# Patient Record
Sex: Male | Born: 1975
Health system: Southern US, Community
[De-identification: ages and names within clinical notes are randomized; demographics above are authoritative.]

## PROBLEM LIST (undated history)

## (undated) DIAGNOSIS — I10 Essential (primary) hypertension: Secondary | ICD-10-CM

## (undated) DIAGNOSIS — R251 Tremor, unspecified: Secondary | ICD-10-CM

## (undated) DIAGNOSIS — G249 Dystonia, unspecified: Secondary | ICD-10-CM

## (undated) HISTORY — PX: CHOLECYSTECTOMY: SHX55

## (undated) HISTORY — DX: Essential (primary) hypertension: I10

---

## 2000-02-02 ENCOUNTER — Inpatient Hospital Stay (HOSPITAL_COMMUNITY): Admission: EM | Admit: 2000-02-02 | Discharge: 2000-02-07 | Payer: Self-pay | Admitting: Psychiatry

## 2005-07-08 ENCOUNTER — Inpatient Hospital Stay (HOSPITAL_COMMUNITY): Admission: EM | Admit: 2005-07-08 | Discharge: 2005-07-10 | Payer: Self-pay | Admitting: Emergency Medicine

## 2010-03-12 ENCOUNTER — Emergency Department (HOSPITAL_COMMUNITY)
Admission: EM | Admit: 2010-03-12 | Discharge: 2010-03-13 | Payer: Self-pay | Source: Home / Self Care | Admitting: Emergency Medicine

## 2010-03-13 ENCOUNTER — Inpatient Hospital Stay (HOSPITAL_COMMUNITY)
Admission: RE | Admit: 2010-03-13 | Discharge: 2010-03-16 | Payer: Self-pay | Source: Home / Self Care | Attending: Psychiatry | Admitting: Psychiatry

## 2010-05-04 ENCOUNTER — Emergency Department (HOSPITAL_COMMUNITY)
Admission: EM | Admit: 2010-05-04 | Discharge: 2010-05-04 | Disposition: A | Discharge status: Home / Self Care | Payer: Medicaid Other | Attending: Emergency Medicine | Admitting: Emergency Medicine

## 2010-05-04 ENCOUNTER — Emergency Department (HOSPITAL_COMMUNITY): Admit: 2010-05-04 | Discharge: 2010-05-04 | Disposition: A | Discharge status: Home / Self Care | Payer: Medicaid Other

## 2010-05-04 DIAGNOSIS — Z79899 Other long term (current) drug therapy: Secondary | ICD-10-CM | POA: Insufficient documentation

## 2010-05-04 DIAGNOSIS — R109 Unspecified abdominal pain: Secondary | ICD-10-CM | POA: Insufficient documentation

## 2010-05-04 DIAGNOSIS — I10 Essential (primary) hypertension: Secondary | ICD-10-CM | POA: Insufficient documentation

## 2010-05-04 DIAGNOSIS — E119 Type 2 diabetes mellitus without complications: Secondary | ICD-10-CM | POA: Insufficient documentation

## 2010-05-04 DIAGNOSIS — K8020 Calculus of gallbladder without cholecystitis without obstruction: Secondary | ICD-10-CM | POA: Insufficient documentation

## 2010-05-04 LAB — URINALYSIS, ROUTINE W REFLEX MICROSCOPIC
Bilirubin Urine: NEGATIVE
pH: 7 (ref 5.0–8.0)

## 2010-05-04 LAB — DIFFERENTIAL
Eosinophils Relative: 4 % (ref 0–5)
Neutro Abs: 5.8 10*3/uL (ref 1.7–7.7)

## 2010-05-04 LAB — COMPREHENSIVE METABOLIC PANEL
AST: 20 U/L (ref 0–37)
BUN: 14 mg/dL (ref 6–23)
Calcium: 9.5 mg/dL (ref 8.4–10.5)
Chloride: 104 mEq/L (ref 96–112)
Glucose, Bld: 66 mg/dL — ABNORMAL LOW (ref 70–99)
Potassium: 3.5 mEq/L (ref 3.5–5.1)
Sodium: 137 mEq/L (ref 135–145)
Total Bilirubin: 0.5 mg/dL (ref 0.3–1.2)

## 2010-05-04 LAB — CBC
HCT: 43.5 % (ref 39.0–52.0)
Hemoglobin: 15.4 g/dL (ref 13.0–17.0)
MCHC: 35.4 g/dL (ref 30.0–36.0)
MCV: 80.9 fL (ref 78.0–100.0)
RBC: 5.38 MIL/uL (ref 4.22–5.81)
RDW: 13.4 % (ref 11.5–15.5)
WBC: 9.4 10*3/uL (ref 4.0–10.5)

## 2010-05-04 LAB — LIPASE, BLOOD: Lipase: 25 U/L (ref 11–59)

## 2010-05-15 ENCOUNTER — Other Ambulatory Visit: Payer: Self-pay | Admitting: General Surgery

## 2010-05-15 ENCOUNTER — Encounter (HOSPITAL_COMMUNITY): Payer: Medicaid Other | Attending: General Surgery

## 2010-05-15 DIAGNOSIS — Z01812 Encounter for preprocedural laboratory examination: Secondary | ICD-10-CM | POA: Insufficient documentation

## 2010-05-15 LAB — BASIC METABOLIC PANEL
BUN: 10 mg/dL (ref 6–23)
CO2: 27 mEq/L (ref 19–32)
Calcium: 9.4 mg/dL (ref 8.4–10.5)
Chloride: 101 mEq/L (ref 96–112)
Creatinine, Ser: 0.79 mg/dL (ref 0.4–1.5)
GFR calc non Af Amer: >60 mL/min (ref >60)
Glucose, Bld: 82 mg/dL (ref 70–99)
Potassium: 3.9 mEq/L (ref 3.5–5.1)

## 2010-05-15 LAB — CBC
Hemoglobin: 15.3 g/dL (ref 13.0–17.0)
MCH: 28 pg (ref 26.0–34.0)
MCHC: 33.6 g/dL (ref 30.0–36.0)
Platelets: 272 10*3/uL (ref 150–400)

## 2010-05-18 ENCOUNTER — Other Ambulatory Visit: Payer: Self-pay | Admitting: General Surgery

## 2010-05-18 ENCOUNTER — Ambulatory Visit (HOSPITAL_COMMUNITY)
Admission: RE | Admit: 2010-05-18 | Discharge: 2010-05-18 | Disposition: A | Discharge status: Home / Self Care | Payer: Medicaid Other | Source: Ambulatory Visit | Attending: General Surgery | Admitting: General Surgery

## 2010-05-18 DIAGNOSIS — K8020 Calculus of gallbladder without cholecystitis without obstruction: Secondary | ICD-10-CM | POA: Insufficient documentation

## 2010-05-18 NOTE — H&P (Signed)
NAME:  Aaron Coffey, Aaron Coffey NO.:  [id number]  MEDICAL RECORD NO.:  [id number]           PATIENT TYPE:  O  LOCATION:  DAY                           FACILITY:  [facility]  PHYSICIAN:  Tilford Pillar, MD      DATE OF BIRTH:  07-13-1975  DATE OF ADMISSION: DATE OF DISCHARGE:  LH                             HISTORY & PHYSICAL   CHIEF COMPLAINT:  Right upper quadrant abdominal pain.  HISTORY OF PRESENT ILLNESS:  The patient is a 35 year old male with a history of increasing right upper quadrant abdominal pain.  This has increased over the last several weeks and actually presents to the emergency department with the pain.  He has had no similar symptomatology.  He has no nausea or vomiting.  No fevers or chills. Pain was localized to the right upper quadrant with no significant radiation.  He does have occasional bloating with fatty greasy food.  He denies any change in bowel movements.  No melena.  No hematochezia.  No history of jaundice.  PAST MEDICAL HISTORY:  Hypertension and diabetes mellitus.  PAST SURGICAL HISTORY:  None.  MEDICATIONS:  He is on a hypertensive medication.  He is not insulin. He is on oral anti-glycemic medication although the patient is not aware of the names.  ALLERGIES:  No known drug allergies.  SOCIAL HISTORY:  No tobacco.  Occasional alcohol.  No recreational drug abuse.  Occupation, none.  FAMILY HISTORY:  Positive family history of gallbladder disease.  No other pertinent or contributory past family medical history.  REVIEW OF SYSTEMS:  CONSTITUTIONAL:  Unremarkable.  EYES:  Unremarkable. Uses spironolactone.  RESPIRATORY:  Unremarkable.  CARDIOVASCULAR: Unremarkable.  GASTROINTESTINAL:  As per HPI.  GENITOURINARY:  Increased frequency of urination.  MUSCULOSKELETAL:  Unremarkable.  SKIN: Unremarkable.  ENDOCRINE:  Complaints of no energy and increased thirst. NEURO:  Complains of tremors.  PHYSICAL EXAMINATION:  GENERAL:  The patient  is an obese male, in no acute distress.  He is alert and oriented x3. HEENT:  No deformities or masses.  Eyes:  Pupils equal, round, reactive. Extraocular movements are intact.  No scleral icterus or conjunctival pallor is noted.  Oral mucosa pink.  Normal occlusion. NECK:  Trachea is midline.  No cervical lymphadenopathy.  He has no thyroid nodularity or goiter. PULMONARY:  Unlabored respiration.  He is clear to auscultation bilaterally.  No wheezes or crackles. CARDIOVASCULAR:  Regular rate and rhythm.  No murmurs or gallops. ABDOMEN:  Positive bowel sounds.  Abdomen is soft, obese.  He does have mild right upper quadrant abdominal pain.  No classic Eulah Pont sign is elicited.  No hernias or masses are apparent. EXTREMITIES:  Warm and dry.  PERTINENT LABORATORY AND RADIOGRAPHIC STUDIES:  The patient does have a right upper quadrant ultrasound which demonstrates stones within the gallbladder.  No biliary tree dilatation.  No pericholecystic fluid or gallbladder wall thickening.  ASSESSMENT AND PLAN:  Cholelithiasis.  At this time the risks and alternatives of laparoscopic possible open cholecystectomy were discussed at length with the patient including, but not limited risk of bleeding, infection, bile leak, small-bowel injury, bile duct injury as  well as possibility of intraoperative cardiac and pulmonary events.  His questions and concerns are addressed.  He will be consented to surgical intervention as early as convenient.     Tilford Pillar, MD     BZ/MEDQ  D:  05/17/2010  T:  05/17/2010  Job:  [id number]  Electronically Signed by Tilford Pillar MD on 05/18/2010 11:48:21 AM

## 2010-06-08 NOTE — Op Note (Signed)
NAME:  Aaron Coffey, Aaron Coffey NO.:  [id number]  MEDICAL RECORD NO.:  [id number]           PATIENT TYPE:  O  LOCATION:  DAYP                          FACILITY:  [facility]  PHYSICIAN:  Tilford Pillar, MD      DATE OF BIRTH:  1975/09/05  DATE OF PROCEDURE:  05/18/2010 DATE OF DISCHARGE:  05/18/2010                              OPERATIVE REPORT   PREOPERATIVE DIAGNOSES:  Cholelithiasis.  POSTOPERATIVE DIAGNOSIS:  Cholelithiasis.  PROCEDURE:  Laparoscopic cholecystectomy.  SURGEON:  Tilford Pillar, MD  ANESTHESIA:  General endotracheal.  LOCAL ANESTHETIC:  0.5% Sensorcaine plain.  SPECIMEN:  Gallbladder.  ESTIMATED BLOOD LOSS:  Minimal.  INDICATIONS:  The patient is a 35 year old male who presented to my office with a history of right upper quadrant abdominal pain.  Workup was consistent for biliary etiology.  Risks, benefits and alternatives of laparoscopic possible open cholecystectomy were discussed at length with the patient including but not limited to the risk of bleeding, infection, bile leak, small bowel injury, common bile duct injury as well as possibility of intraoperative cardiac and pulmonary events.  His questions and concerns were addressed and the patient was consented for planned procedure.  OPERATION:  The patient was taken to the operating room and was placed in supine position on the operating room table, at which time the general anesthetic was administered.  Once the patient was asleep, he was endotracheally intubated by Anesthesia.  At this point his abdomen was prepped with DuraPrep solution and draped in standard fashion.  A stab incision was created supraumbilically with an 11-blade scalpel. Additional dissection down through the subcuticular tissue was carried out using a Kocher clamp which was utilized to grasp the anterior abdominal fascia with this anteriorly.  A Veress needle was inserted. Saline drop test utilized to confirm  intraperitoneal placement and then pneumoperitoneum was initiated.  Once sufficient pneumoperitoneum was obtained, an 11-mm trocar was inserted over laparoscope allowing visualization of the trocar entering into the peritoneal cavity.  At this time, the inner cannula was removed.  The laparoscope was reinserted.  There was no evidence of any Veress or trocar needle placement injury.  At this time the remaining trocars were placed with the 11-mm trocar in the epigastrium, a 5 mm trocar in the midline between the two 11-mm trocars and the 5-mm trocar in the right lateral abdominal wall.  The patient was placed in a reverse Trendelenburg left lateral decubitus position.  The gallbladder was grasped and the fundus was lifted up and over the right lobe of the liver.  The peritoneal reflection onto the infundibulum was bluntly stripped using a [place] exposing the cystic duct which was then entered as it entered into the infundibulum.  A window was created behind the cystic duct. Three EndoClips were placed proximally, one distally and the cystic duct was divided between two most distal clips.  Similarly the cystic artery was identified.  Two EndoClips were placed proximally, one distally and the cystic artery was divided between the two most distal clips.  At this time I did elect to use electrocautery to dissect the gallbladder free from the gallbladder fossa.  During this dissection, I did encounter a posterior branch of the cystic artery which was quickly controlled using additional EndoClips x2 and then the gallbladder again was removed from the gallbladder fossa using electrocautery.  Once it was freed, it was placed into an EndoCatch bag and placed up and over the right lobe of the liver.  Inspection of the gallbladder fossa demonstrated excellent hemostasis having been obtained with electrocautery.  The EndoClips were noted to be in good position.  There was no evidence any  bile leak or any bleeding.  Due to the somewhat raw nature of the gallbladder fossa, I did place a piece of Surgicel SnoW into the gallbladder fossa for additional assistance, additional hemostasis.  At this time I turned attention to closure.  Using an Endoclose suture passing device, a 2-0 Vicryl suture was passed through both the 11-mm trocar sites.  With these sutures in place, the gallbladder was retrieved and removed through the umbilical trocar site. Due to the size of the stones as well as the size of the gallbladder, I did need to utilize both blunt and sharp dissection to enlarge the trocar site adequately to remove the gallbladder.  During this dissection, I did create a small slit within the EndoCatch bag.  The EndoCatch bag was removed separately from the gallbladder but to note it was intact.  The gallbladder was removed intact.  There was no spillage or any leakage of any stones and the gallbladder was sent as a permanent specimen to Pathology.  At this time the pneumoperitoneum was evacuated. Trocars were removed.  The Vicryl sutures were secured.  The local anesthetic was instilled.  A 4-0 Monocryl was utilized to reapproximate the skin edges at all 4 trocar sites.  The skin was washed and dried with moist and dry towel and Octylseal dermatic glue placed over all four trocar sites, allowed to dry and the drapes were removed.  The patient was allowed to come out of general anesthetic, was transferred back to regular hospital bed and was transferred to the postanesthetic care unit in stable condition.  At the conclusion of procedure, all instruments, sponge and needle counts were correct.  The patient tolerated the procedure extremely well.     Tilford Pillar, MD     BZ/MEDQ  D:  05/18/2010  T:  05/19/2010  Job:  [id number]  Electronically Signed by Tilford Pillar MD on 06/07/2010 09:26:56 PM

## 2010-06-11 LAB — DIFFERENTIAL
Basophils Absolute: 0 10*3/uL (ref 0.0–0.1)
Eosinophils Absolute: 0.3 10*3/uL (ref 0.0–0.7)
Eosinophils Relative: 3 % (ref 0–5)
Lymphocytes Relative: 21 % (ref 12–46)

## 2010-06-11 LAB — CBC
MCV: 82.6 fL (ref 78.0–100.0)
Platelets: 373 10*3/uL (ref 150–400)
RDW: 13.6 % (ref 11.5–15.5)
WBC: 9.8 10*3/uL (ref 4.0–10.5)

## 2010-06-11 LAB — HEPATIC FUNCTION PANEL
Albumin: 3.8 g/dL (ref 3.5–5.2)
Alkaline Phosphatase: 73 U/L (ref 39–117)
Total Protein: 7 g/dL (ref 6.0–8.3)

## 2010-06-11 LAB — BASIC METABOLIC PANEL
BUN: 11 mg/dL (ref 6–23)
Calcium: 9.5 mg/dL (ref 8.4–10.5)
Chloride: 103 mEq/L (ref 96–112)
Creatinine, Ser: 0.93 mg/dL (ref 0.4–1.5)
GFR calc non Af Amer: >60 mL/min (ref >60)

## 2010-06-11 LAB — URINALYSIS, ROUTINE W REFLEX MICROSCOPIC
Glucose, UA: NEGATIVE mg/dL
Hgb urine dipstick: NEGATIVE
Specific Gravity, Urine: 1.025 (ref 1.005–1.030)

## 2010-06-11 LAB — GLUCOSE, CAPILLARY
Glucose-Capillary: 106 mg/dL — ABNORMAL HIGH (ref 70–99)
Glucose-Capillary: 108 mg/dL — ABNORMAL HIGH (ref 70–99)
Glucose-Capillary: 121 mg/dL — ABNORMAL HIGH (ref 70–99)

## 2010-06-11 LAB — RAPID URINE DRUG SCREEN, HOSP PERFORMED
Barbiturates: NOT DETECTED
Benzodiazepines: NOT DETECTED

## 2010-06-11 LAB — ETHANOL: Alcohol, Ethyl (B): <5 mg/dL (ref 0–10)

## 2010-08-17 NOTE — Consult Note (Signed)
NAME:  Aaron Coffey, BORNTREGER NO.:  [id number]   MEDICAL RECORD NO.:  [id number]          PATIENT TYPE:  INP   LOCATION:  A212                          FACILITY:  [facility]   PHYSICIAN:  Kofi A. Gerilyn Pilgrim, M.D. DATE OF BIRTH:  12-Nov-1975   DATE OF CONSULTATION:  DATE OF DISCHARGE:                                   CONSULTATION   NEUROLOGICAL CONSULTATION:   REASON FOR CONSULTATION:  Altered mental status with fever and possible  encephalitis.   HISTORY:  This is a 35 year old Caucasian man who has a baseline history of  psychiatric problems.  The patient presented to the emergency room with  alteration of consciousness and was intubated.  He did have a low-grade  temperature.  Initial history raised the possibility that the patient may  have had an overdose.  At the same time however the notes indicate the  patient did not take more prescribed medications of his baseline medicines.  There is also no report of illegal drug use.  The patient was intubated  overnight and was weaned then extubated.  We were able to get a better  history this evening.  The patient himself indicates that he took about a  half a bottle of Robitussin because he was depressed and had issues with  some friends of his.  He did not elaborate what type of issues these were.  In general he seemed to have poor insight and cannot relate his baseline  medical history and other history in detail.  The patient, while admitting  to taking this medication, denies at this time having suicidal ideation.  Additionally, he also reports that although he took the Benadryl he did it  because he was mad but did not want to comit suicide.  He currently has  denied suicidal ideation at this time.  This was asked several times.  He  denies any headaches, rashes, illicit drug use and reports that he was  normal before this happened.   PAST MEDICAL HISTORY:  The patient has had a psychiatric admission  previously due to  behavioral disruption.  There is a history of mild mental  retardation/developmental delay, ADHD, essential tremor, diabetes mellitus  and hyperlipidemia.   ADMISSION MEDICATIONS:  Unknown at this time.   SOCIAL HISTORY:  No alcohol, drug or illicit drug use.   REVIEW OF SYSTEMS:  As stated in history of present illness.   PHYSICAL EXAMINATION:  VITAL SIGNS:  The patient is afebrile and has been  afebrile (low grade 99.9 early this morning, latest temperature 99.4).  Blood pressure 110/60, pulse 80, respirations 20.  GENERAL:  The patient is awake and alert.  He is in no acute distress.  He  is extubated and on the floor.  HEENT EVALUATION:  Neck is supple.  EXTREMITIES:  Moved all four extremities.  No significant varicosities or  edema.  SKIN:  No rashes noted.  There is a bruise on the right knee.  NEUROLOGICAL:  Mentation:  The patient is awake and alert.  He has  significant difficulty with insight and judgment.  He is immature appearing  with  a childish voice and childish gestures.  No dysarthria or aphasia  noted.  Followed commands bilaterally well.  Cranial nerve evaluation:  He  has a right exotropia.  Extraocular movements are intact.  Visual fields are  full.  Facial muscle strength symmetric.  Tongue is midline.  Uvula is  midline.  Shoulder shrugs are normal.  Motor examination shows normal tone,  bulk and strength.  Coordination shows frequent moderate amplitudes/moderate  frequency tremors involving both upper extremities most pronounced in the  postural position but present at rest and with activity.  Reflexes are  preserved.  Plantar reflexes both downgoing.  Sensation normal to  temperature and light touch.   STUDIES:  CT scan of the brain shows no acute process.  Chest x-ray done  while intubated shows bibasilar atelectasis.  Urinalysis negative.  Urine  drug screen positive for metabolites of benzodiazepine.  Toxicology screen  negative for alcohol, salicylates  and Tylenol.  WBC 12, hemoglobin 15,  platelet count of 438, ESR 4.  Sodium 137, potassium 3.3, chloride 110, CO2  20, glucose 100, BUN 10, creatinine 0.8, calcium 9.  Liver enzymes normal.  Spinal fluid analysis:  Wbc's 0, rbc's 0, glucose 74, and protein 43.   ASSESSMENT:  1.  Resolving encephalopathy due to overdose on Robitussin.  2.  Baseline essential tremor.  3.  Baseline mental retardation.  4.  Baseline psychiatric problems.  5.  Depression.   RECOMMENDATIONS:  1.  I think we should discontinue his acyclovir and doxycycline.  2.  I think the ACT team ought to be consulted.  3.  Consider EEG to rule out for seizure.   Thanks for this consultation.      Kofi A. Gerilyn Pilgrim, M.D.  Electronically Signed     KAD/MEDQ  D:  07/09/2005  T:  07/09/2005  Job:  [id number]

## 2010-08-17 NOTE — H&P (Signed)
NAME:  Aaron Coffey, Aaron Coffey NO.:  [id number]   MEDICAL RECORD NO.:  [id number]          PATIENT TYPE:  EMS   LOCATION:  ED                            FACILITY:  [facility]   PHYSICIAN:  Madelin Rear. Sherwood Gambler, MD  DATE OF BIRTH:  1976-03-25   DATE OF ADMISSION:  07/08/2005  DATE OF DISCHARGE:  LH                                HISTORY & PHYSICAL   CHIEF COMPLAINT:  Mental status changes.   HISTORY OF PRESENT ILLNESS:  Patient was seen by his mother and talked to  around one hour prior to the onset of mental status changes.  EMS personnel  were alerted with him stating to them that he possibly overdosed on  medication.  A thorough review of his medications in the field revealed no  missing pills.  He also had no access to any other illicit medications that  are known.   PAST MEDICAL HISTORY:  Pertinent for behavioral difficulties, delayed  development mentally/mild mental retardation, benign essential tremor,  maintained on beta blocker for same, and hyperlipidemia, maintained on  Zocor.   SOCIAL HISTORY:  No illicit drug use.  No known alcohol.   FAMILY HISTORY:  Noncontributory.   REVIEW OF SYSTEMS:  Unavailable, as the patient is intubated at the time of  my exam.  History was obtained, however, from his family members, including  his grandmother and his mother.  There were no headaches, fevers known prior  to onset.  No tick bites.   PHYSICAL EXAMINATION:  GENERAL:  He is paralyzed on Pavulon and intubated  due to agitation exhibited in the emergency department per emergency room  physician evaluation.  SKIN:  Unremarkable with no evidence of exanthem or petechiae.  NECK:  No JVD or adenopathy.   DICTATION ENDS HERE.      Madelin Rear. Sherwood Gambler, MD  Electronically Signed     LJF/MEDQ  D:  07/08/2005  T:  07/08/2005  Job:  [id number]

## 2010-08-17 NOTE — H&P (Signed)
NAME:  KRIS, NO NO.:  [id number]   MEDICAL RECORD NO.:  [id number]          PATIENT TYPE:  INP   LOCATION:  IC06                          FACILITY:  [facility]   PHYSICIAN:  Madelin Rear. Sherwood Gambler, MD  DATE OF BIRTH:  [birth year]   DATE OF ADMISSION:  07/08/2005  DATE OF DISCHARGE:  LH                                HISTORY & PHYSICAL   CHIEF COMPLAINT:  Mental status changes.   HISTORY OF PRESENT ILLNESS:  Patient had EMS alerted at home one hour after  speaking apparently normally to his mother, stating to them that he  overdosed possibly on some medications.  Thorough review of his medications  in the field by EMS personnel revealed no excess consumption of his known  medication regimen, nor was there any access to illicit meds or other  medications in the house, according to his mother.  History had to be  obtained from multiple people, including his grandmother and his mother,  because the patient is intubated and unresponsive in the emergency room.  He  presented to the emergency department with a low-grade fever, mental status,  and agitation.  He was not conversant at that time, per Dr. Denny Levy note.   PAST MEDICAL HISTORY:  Behavioral difficulties, developmental delays, and  ADHD.  He has hyperlipidemia.   SOCIAL HISTORY:  No alcohol or other drug use, as noted above.   FAMILY HISTORY:  Noncontributory.   REVIEW OF SYSTEMS:  Unavailable secondary to the patient's paralyzed,  intubated state.  Again, the information was obtained by family members, the  ER physician, and EMS personnel.   PHYSICAL EXAMINATION:  GENERAL:  He is intubated and paralyzed on Pavulon.  HEAD/NECK:  No JVD or adenopathy.  The suppleness of the neck could not be  assessed secondary to paralyzed state.  CHEST:  Clear.  CARDIAC:  Regular rhythm without murmur, rub or gallop.  ABDOMEN:  Benign.  No organomegaly or masses.  EXTREMITIES:  No clubbing, cyanosis or edema.  __________.   Complete flaccid  paralysis secondary to Pavulon.   LABORATORY:  CT head is pending, as is a lumbar puncture, which is  anticipated.   His blood count revealed an elevated white count slightly at 12.5 with a  little left shift and mild elevation of his platelet count.  His metabolic  profile was unremarkable.  Urinalysis pending.  Urine drug screen pending.   Chest x-ray showed an intubated patient without evidence of pneumonic  infiltrate.   IMPRESSION:  Mental status changes with fever, rule out encephalitis or  meningitis.  Appropriate studies have been ordered and are pending.  All  possibilities will be covered with broad-spectrum antibiotic and antiviral  treatments specifically aimed at herpes virus, pending CSF studies.  Will  cover the most life-threatening and neurologically devastating possibilities  at present, pending further studies.  A neurology consultation is also  anticipated.      Madelin Rear. Sherwood Gambler, MD  Electronically Signed     LJF/MEDQ  D:  07/08/2005  T:  07/08/2005  Job:  [id number]

## 2010-08-17 NOTE — Discharge Summary (Signed)
NAME:  ADEM, COSTLOW NO.:  [id number]   MEDICAL RECORD NO.:  [id number]          PATIENT TYPE:  INP   LOCATION:  A212                          FACILITY:  [facility]   PHYSICIAN:  Madelin Rear. Sherwood Gambler, MD  DATE OF BIRTH:  [birth year]   DATE OF ADMISSION:  07/08/2005  DATE OF DISCHARGE:  04/11/2007LH                                 DISCHARGE SUMMARY   DISCHARGE MEDICATIONS:  1.  Zocor 20 mg p.o. daily.  2.  Avandia 4 mg p.o. daily.  3.  Topamax 25 mg q.a.m. and q.p.m.  4.  Inderal 60 mg p.o. daily.  5.  Amaryl 2 mg p.o. daily.   DISCHARGE DIAGNOSES:  1.  Mental retardation.  2.  Benign essential tremor.  3.  Encephalopathy secondary to overdose of Robitussin,  4.  Depression.   SUMMARY:  The patient was admitted with mental status changes and  questionable inadvertent overdose/unintentional overdose of Robitussin.  The  possibility of encephalitis was entertained, and consultation ensued with  Dr. Gerilyn Pilgrim of neurology.  He had an uneventful recovery, and antibiotics  and antiviral medications were discontinued shortly post discharge as mental  status improved and laboratories ruled out the above differential.  He was  stable the day of discharge.   FOLLOWUP:  Follow up in office as needed.      Madelin Rear. Sherwood Gambler, MD  Electronically Signed     LJF/MEDQ  D:  08/06/2005  T:  08/07/2005  Job:  [id number]
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
## 2010-08-17 NOTE — H&P (Signed)
NAME:  Aaron Coffey, Aaron Coffey NO.:  [id number]   MEDICAL RECORD NO.:  [id number]          PATIENT TYPE:  EMS   LOCATION:  ED                            FACILITY:  [facility]   PHYSICIAN:  Madelin Rear. Sherwood Gambler, MD  DATE OF BIRTH:  18-Apr-1975   DATE OF ADMISSION:  07/08/2005  DATE OF DISCHARGE:  LH                                HISTORY & PHYSICAL   CHIEF COMPLAINT:  Mental status changes.   HISTORY OF PRESENT ILLNESS:  EMS personnel appeared at the patient's house  __________.  DICTATION ENDED AT THIS POINT.      Madelin Rear. Sherwood Gambler, MD  Electronically Signed     LJF/MEDQ  D:  07/08/2005  T:  07/08/2005  Job:  [id number]

## 2010-08-17 NOTE — Procedures (Signed)
NAME:  Aaron Coffey, Aaron Coffey NO.:  [id number]   MEDICAL RECORD NO.:  [id number]          PATIENT TYPE:  INP   LOCATION:  A212                          FACILITY:  [facility]   PHYSICIAN:  Edward L. Juanetta Gosling, M.D.DATE OF BIRTH:  Sep 24, 1975   DATE OF PROCEDURE:  DATE OF DISCHARGE:                                EKG INTERPRETATION   INTERPRETATION:  The rhythm appears to be a supraventricular tachycardia  with a rate of about 110.  There is left atrial enlargement.  Q-waves are  seen in the inferior leads, which may indicate a previous inferior  infarction, and clinical correlation suggested.  Q-T interval is prolonged.  The computer has read ST depression with potential for subendocardial  injury; I agree there is ST depression.  I do not see evidence of injury at  this time.   IMPRESSION:  Abnormal electrocardiogram.      Edward L. Juanetta Gosling, M.D.  Electronically Signed     ELH/MEDQ  D:  07/10/2005  T:  07/10/2005  Job:  [id number]

## 2014-02-28 ENCOUNTER — Encounter (HOSPITAL_COMMUNITY): Payer: Self-pay | Admitting: *Deleted

## 2014-02-28 ENCOUNTER — Emergency Department (HOSPITAL_COMMUNITY)
Admission: EM | Admit: 2014-02-28 | Discharge: 2014-03-01 | Disposition: A | Discharge status: Home / Self Care | Payer: Medicare Other | Attending: Emergency Medicine | Admitting: Emergency Medicine

## 2014-02-28 DIAGNOSIS — R4689 Other symptoms and signs involving appearance and behavior: Secondary | ICD-10-CM

## 2014-02-28 DIAGNOSIS — R251 Tremor, unspecified: Secondary | ICD-10-CM | POA: Insufficient documentation

## 2014-02-28 DIAGNOSIS — R45851 Suicidal ideations: Secondary | ICD-10-CM | POA: Diagnosis present

## 2014-02-28 DIAGNOSIS — R4589 Other symptoms and signs involving emotional state: Secondary | ICD-10-CM

## 2014-02-28 LAB — COMPREHENSIVE METABOLIC PANEL
ALBUMIN: 4.7 g/dL (ref 3.5–5.2)
ALK PHOS: 74 U/L (ref 39–117)
ALT: 35 U/L (ref 0–53)
ANION GAP: 18 — AB (ref 5–15)
AST: 29 U/L (ref 0–37)
BUN: 11 mg/dL (ref 6–23)
CHLORIDE: 99 meq/L (ref 96–112)
CO2: 23 mEq/L (ref 19–32)
Calcium: 10.2 mg/dL (ref 8.4–10.5)
Creatinine, Ser: 0.92 mg/dL (ref 0.50–1.35)
GFR calc Af Amer: >90 mL/min (ref >90)
GFR calc non Af Amer: >90 mL/min (ref >90)
Glucose, Bld: 118 mg/dL — ABNORMAL HIGH (ref 70–99)
POTASSIUM: 3.3 meq/L — AB (ref 3.7–5.3)
SODIUM: 140 meq/L (ref 137–147)
TOTAL PROTEIN: 8.2 g/dL (ref 6.0–8.3)
Total Bilirubin: 0.6 mg/dL (ref 0.3–1.2)

## 2014-02-28 LAB — CBC WITH DIFFERENTIAL/PLATELET
BASOS ABS: 0 10*3/uL (ref 0.0–0.1)
BASOS PCT: 0 % (ref 0–1)
EOS ABS: 0.1 10*3/uL (ref 0.0–0.7)
Eosinophils Relative: 1 % (ref 0–5)
HCT: 45.9 % (ref 39.0–52.0)
Hemoglobin: 16.3 g/dL (ref 13.0–17.0)
Lymphocytes Relative: 30 % (ref 12–46)
Lymphs Abs: 2.8 10*3/uL (ref 0.7–4.0)
MCH: 29.1 pg (ref 26.0–34.0)
MCHC: 35.5 g/dL (ref 30.0–36.0)
MCV: 82 fL (ref 78.0–100.0)
MONOS PCT: 9 % (ref 3–12)
Monocytes Absolute: 0.8 10*3/uL (ref 0.1–1.0)
NEUTROS ABS: 5.4 10*3/uL (ref 1.7–7.7)
NEUTROS PCT: 60 % (ref 43–77)
PLATELETS: 302 10*3/uL (ref 150–400)
RBC: 5.6 MIL/uL (ref 4.22–5.81)
RDW: 13.3 % (ref 11.5–15.5)
WBC: 9.1 10*3/uL (ref 4.0–10.5)

## 2014-02-28 LAB — URINALYSIS, ROUTINE W REFLEX MICROSCOPIC
GLUCOSE, UA: NEGATIVE mg/dL
Hgb urine dipstick: NEGATIVE
KETONES UR: NEGATIVE mg/dL
Leukocytes, UA: NEGATIVE
NITRITE: NEGATIVE
PH: 5.5 (ref 5.0–8.0)
PROTEIN: NEGATIVE mg/dL
Specific Gravity, Urine: >1.03 — ABNORMAL HIGH (ref 1.005–1.030)
Urobilinogen, UA: 0.2 mg/dL (ref 0.0–1.0)

## 2014-02-28 LAB — ETHANOL

## 2014-02-28 MED ORDER — POTASSIUM CHLORIDE CRYS ER 20 MEQ PO TBCR
40.0000 meq | EXTENDED_RELEASE_TABLET | Freq: Once | ORAL | Status: AC
  Administered 2014-02-28: 40 meq via ORAL
  Filled 2014-02-28: qty 2

## 2014-02-28 NOTE — ED Notes (Signed)
Made patient aware that he can not have cell phone or piercing in at this time. Placed phone with belongings.

## 2014-02-28 NOTE — ED Provider Notes (Signed)
CSN: [id number]     Arrival date & time 02/28/14  1700 History   First MD Initiated Contact with Patient 02/28/14 1920     Chief Complaint  Patient presents with  . V70.1     (Consider location/radiation/quality/duration/timing/severity/associated sxs/prior Treatment) The history is provided by the patient and the police.   38 year old male brought in by police. After holding a knife to his neck after an argument with his mother. Patient is not IVC. Patient denies any intent to harm himself here. Patient does come across as anxious. There is no marks on his neck. Denies any suicidal attempt this time her thoughts at this time. Also denies any attempt to hurt his mother. Patient's past medical history only significant for occasional tremors. In removal of his gallbladder. Current medications is none. Patient's primary care doctors Dr. Regino SchultzeMcGough.  Past Medical History  Diagnosis Date  . Occasional tremors    Past Surgical History  Procedure Laterality Date  . Cholecystectomy     No family history on file. History  Substance Use Topics  . Smoking status: Never Smoker   . Smokeless tobacco: Not on file  . Alcohol Use: Yes    Review of Systems  Constitutional: Negative for fever and fatigue.  HENT: Negative for congestion.   Eyes: Negative for visual disturbance.  Respiratory: Negative for shortness of breath.   Cardiovascular: Negative for chest pain.  Gastrointestinal: Negative for abdominal pain.  Genitourinary: Negative for dysuria and hematuria.  Musculoskeletal: Negative for back pain and neck pain.  Skin: Negative for rash and wound.  Neurological: Positive for tremors. Negative for dizziness, weakness, numbness and headaches.  Hematological: Does not bruise/bleed easily.  Psychiatric/Behavioral: Negative for suicidal ideas. The patient is nervous/anxious.       Allergies  Review of patient's allergies indicates no known allergies.  Home Medications   Prior to  Admission medications   Not on File   BP 111/96 mmHg  Pulse 101  Temp(Src) 99 F (37.2 C) (Oral)  Resp 20  Ht 5\' 7"  (1.702 m)  Wt 226 lb (102.513 kg)  BMI 35.39 kg/m2  SpO2 99% Physical Exam  Constitutional: He appears well-developed and well-nourished. No distress.  HENT:  Head: Normocephalic and atraumatic.  Mouth/Throat: Oropharynx is clear and moist.  Eyes: Conjunctivae and EOM are normal. Pupils are equal, round, and reactive to light.  Neck: Normal range of motion. Neck supple.  Despite the history of the knife to the neck there is no wounds to the neck not even superficial.  Cardiovascular: Normal rate, regular rhythm and normal heart sounds.   Pulmonary/Chest: Effort normal and breath sounds normal. No respiratory distress.  Abdominal: Soft. Bowel sounds are normal. There is no tenderness.  Musculoskeletal: Normal range of motion.  Neurological: He is alert. No cranial nerve deficit. He exhibits normal muscle tone. Coordination normal.  A tension-type tremor.  Nursing note and vitals reviewed.   ED Course  Procedures (including critical care time) Labs Review Labs Reviewed  COMPREHENSIVE METABOLIC PANEL - Abnormal; Notable for the following:    Potassium 3.3 (*)    Glucose, Bld 118 (*)    Anion gap 18 (*)    All other components within normal limits  CBC WITH DIFFERENTIAL  ETHANOL  URINE RAPID DRUG SCREEN (HOSP PERFORMED)  URINALYSIS, ROUTINE W REFLEX MICROSCOPIC   Results for orders placed or performed during the hospital encounter of 02/28/14  CBC WITH DIFFERENTIAL  Result Value Ref Range   WBC 9.1 4.0 - 10.5  K/uL   RBC 5.60 4.22 - 5.81 MIL/uL   Hemoglobin 16.3 13.0 - 17.0 g/dL   HCT 16.145.9 09.639.0 - 04.552.0 %   MCV 82.0 78.0 - 100.0 fL   MCH 29.1 26.0 - 34.0 pg   MCHC 35.5 30.0 - 36.0 g/dL   RDW 40.913.3 81.111.5 - 91.415.5 %   Platelets 302 150 - 400 K/uL   Neutrophils Relative % 60 43 - 77 %   Neutro Abs 5.4 1.7 - 7.7 K/uL   Lymphocytes Relative 30 12 - 46 %    Lymphs Abs 2.8 0.7 - 4.0 K/uL   Monocytes Relative 9 3 - 12 %   Monocytes Absolute 0.8 0.1 - 1.0 K/uL   Eosinophils Relative 1 0 - 5 %   Eosinophils Absolute 0.1 0.0 - 0.7 K/uL   Basophils Relative 0 0 - 1 %   Basophils Absolute 0.0 0.0 - 0.1 K/uL  Comprehensive metabolic panel  Result Value Ref Range   Sodium 140 137 - 147 mEq/L   Potassium 3.3 (L) 3.7 - 5.3 mEq/L   Chloride 99 96 - 112 mEq/L   CO2 23 19 - 32 mEq/L   Glucose, Bld 118 (H) 70 - 99 mg/dL   BUN 11 6 - 23 mg/dL   Creatinine, Ser 7.820.92 0.50 - 1.35 mg/dL   Calcium 95.610.2 8.4 - 21.310.5 mg/dL   Total Protein 8.2 6.0 - 8.3 g/dL   Albumin 4.7 3.5 - 5.2 g/dL   AST 29 0 - 37 U/L   ALT 35 0 - 53 U/L   Alkaline Phosphatase 74 39 - 117 U/L   Total Bilirubin 0.6 0.3 - 1.2 mg/dL   GFR calc non Af Amer >90 >90 mL/min   GFR calc Af Amer >90 >90 mL/min   Anion gap 18 (H) 5 - 15  Ethanol  Result Value Ref Range   Alcohol, Ethyl (B) <11 0 - 11 mg/dL     Imaging Review No results found.   EKG Interpretation None      MDM   Final diagnoses:  Suicidal behavior    Patient came in voluntarily by police. No IVC. Patient lives with his mother. He got upset with the knife to his throat mother called police. Patient states that he does not intend to kill himself he just got mad and got spontaneous. Denies any suicidal desires at this time. Also denies any homicidal ideation or thoughts. Will have behavioral health act team interview. Patient is nervous and anxious. Following their interview if they feel he can be discharged home that is fine.  Patient has an intention type tremor. Is been documented before. Patient cooperative here. Patient's potassium was slightly low at 3.3. Patient's creatinine is normal. Patient given 40 mEq of potassium by mouth.  Vanetta MuldersScott Tracy Gerken, MD 02/28/14 2144

## 2014-02-28 NOTE — ED Notes (Signed)
Pt dropped off by police. Pt states he was having issues at home with his mother about having a friend come over. Pt lives with his mother.Pt got upset and put a knife to his throat, then his mother called him and she called police. Pt hold police that he wanted to come to the ED to get checked out. Pt states he did not want to kill himself, states, "I was just mad". Pt denies any thoughts of wanting to harm himself at this time.

## 2014-03-01 DIAGNOSIS — F4325 Adjustment disorder with mixed disturbance of emotions and conduct: Secondary | ICD-10-CM | POA: Diagnosis not present

## 2014-03-01 DIAGNOSIS — R45851 Suicidal ideations: Secondary | ICD-10-CM

## 2014-03-01 DIAGNOSIS — X781XXA Intentional self-harm by knife, initial encounter: Secondary | ICD-10-CM | POA: Diagnosis not present

## 2014-03-01 LAB — RAPID URINE DRUG SCREEN, HOSP PERFORMED
AMPHETAMINES: NOT DETECTED
BENZODIAZEPINES: NOT DETECTED
Barbiturates: POSITIVE — AB
Cocaine: NOT DETECTED
Opiates: NOT DETECTED
TETRAHYDROCANNABINOL: NOT DETECTED

## 2014-03-01 NOTE — ED Notes (Signed)
Patient went to restroom with sitter. Patient given Sprite zero and graham crackers with peanut butter at this time.

## 2014-03-01 NOTE — ED Notes (Signed)
Telepsych performed.  

## 2014-03-01 NOTE — Discharge Instructions (Signed)
Follow-up with one of the [facility], Which are listed below. Call today for an appointment.    No-harm Safety Contract  A no-harm safety contract is a written or verbal agreement between you and a mental health professional to promote safety. It contains specific actions and promises you agree to. The agreement also includes instructions from the therapist or doctor. The instructions will help prevent you from harming yourself or harming others. Harm can be as mild as pinching yourself, but can increase in intensity to actions like burning or cutting yourself. The extreme level of self-harm would be committing suicide. No-harm safety contracts are also sometimes referred to as a Charity fundraiserno-suicide contract, suicide Financial controllerprevention contract, no-harm agreements or decisions, or a Engineer, manufacturing systemssafety contract.  REASONS FOR NO-HARM SAFETY CONTRACTS Safety contracts are just one part of an overall treatment plan to help keep you safe and free of harm. A safety contract may help to relieve anxiety, restore a sense of control, state clearly the alternatives to harm or suicide, and give you and your therapist or doctor a gauge for how you are doing in between visits. Many factors impact the decision to use a no-harm safety contract and its effectiveness. A proper overall treatment plan and evaluation and good patient understanding are the keys to good outcomes. CONTRACT ELEMENTS  A contract can range from simple to complex. They include all or some of the following:  Action statements. These are statements you agree to do or not do. Example: If I feel my life is becoming too difficult, I agree to do the following so there is no harm to myself or others:  Talk with family or friends.  Rid myself of all things that I could use to harm myself.  Do an activity I enjoy or have enjoyed in the recent past. Coping strategies. These are ways to think and feel that decrease stress, such as:  Use of  affirmations or positive statements about self.  Good self-care, including improved grooming, and healthy eating, and healthy sleeping patterns.  Increase physical exercise.  Increase social involvement.  Focus on positive aspects of life. Crisis management. This would include what to do if there was trouble following the contract or an urge to harm. This might include notifying family or your therapist of suicidal thoughts. Be open and honest about suicidal urges. To prevent a crisis, do the following:  List reasons to reach out for support.  Keep contact numbers and available hours handy. Treatment goals. These are goals would include no suicidal thoughts, improved mood, and feelings of hopefulness. Listed responsibilities of different people involved in care. This could include family members. A family member may agree to remove firearms or other lethal weapons/substances from your ease of access. A timeline. A timeline can be in place from one therapy session to the next session. HOME CARE INSTRUCTIONS   Follow your no-harm safety contract.  Contact your therapist and/or doctor if you have any questions or concerns. MAKE SURE YOU:   Understand these instructions.  Will watch your condition. Noticing any mood changes or suicidal urges.  Will get help right away if you are not doing well or get worse. Document Released: 09/05/2009 Document Revised: 06/10/2011 Document Reviewed: 09/05/2009 [facility] Patient Information 2015 [place], [place]. This information is not intended to replace advice given to you by your health care provider. Make sure you discuss any questions you have with your health care provider.  Suicidal Feelings, How to Help Yourself Everyone feels  sad or unhappy at times, but depressing thoughts and feelings of hopelessness can lead to thoughts of suicide. It can seem as if life is too tough to handle. If you feel as though you have reached the point where suicide is  the only answer, it is time to let someone know immediately.  HOW TO COPE AND PREVENT SUICIDE  Let family, friends, teachers, or counselors know. Get help. Try not to isolate yourself from those who care about you. Even though you may not feel sociable, talk with someone every day. It is best if it is face-to-face. Remember, they will want to help you.  Eat a regularly spaced and well-balanced diet.  Get plenty of rest.  Avoid alcohol and drugs because they will only make you feel worse and may also lower your inhibitions. Remove them from the home. If you are thinking of taking an overdose of your prescribed medicines, give your medicines to someone who can give them to you one day at a time. If you are on antidepressants, let your caregiver know of your feelings so he or she can provide a safer medicine, if that is a concern.  Remove weapons or poisons from your home.  Try to stick to routines. Follow a schedule and remind yourself that you have to keep that schedule every day.  Set some realistic goals and achieve them. Make a list and cross things off as you go. Accomplishments give a sense of worth. Wait until you are feeling better before doing things you find difficult or unpleasant to do.  If you are able, try to start exercising. Even half-hour periods of exercise each day will make you feel better. Getting out in the sun or into nature helps you recover from depression faster. If you have a favorite place to walk, take advantage of that.  Increase safe activities that have always given you pleasure. This may include playing your favorite music, reading a good book, painting a picture, or playing your favorite instrument. Do whatever takes your mind off your depression.  Keep your living space well-lighted. GET HELP Contact a suicide hotline, crisis center, or local suicide prevention center for help right away. Local centers may include a hospital, clinic, community service  organization, social service provider, or health department.  Call your local emergency services (911 in the [place]).  Call a suicide hotline:  1-800-273-TALK (9347281950) in the [place].  1-800-SUICIDE 5103306078) in the [place].  862-726-0511 in the [place] for Spanish-speaking counselors.  4-696-295-2WUX 202-640-3178) in the [place] for TTY users.  Visit the following websites for information and help:  National Suicide Prevention Lifeline: www.suicidepreventionlifeline.org  Hopeline: www.hopeline.com  [employer] for Suicide Prevention: https://www.ayers.com/  For lesbian, gay, bisexual, transgender, or questioning youth, contact [employer]:  3-664-4-I-HKVQQV 301-098-8142) in the [place].  www.thetrevorproject.org  In [place], treatment resources are listed in each province with listings available under [employer] for [employer] or similar titles. Another source for Crisis Centres by [place] is located at http://www.suicideprevention.ca/in-crisis-now/find-a-crisis-centre-now/crisis-centres Document Released: 09/22/2002 Document Revised: 06/10/2011 Document Reviewed: 07/13/2013 [facility] Patient Information 2015 [place], [place]. This information is not intended to replace advice given to you by your health care provider. Make sure you discuss any questions you have with your health care provider.    Emergency Department Resource Guide 1) Find a Doctor and Pay Out of Pocket Although you won't have to find out who is covered by your insurance plan, it is a good idea  to ask around and get recommendations. You will then need to call the office and see if the doctor you have chosen will accept you as a new patient and what types of options they offer for patients who are self-pay. Some doctors offer discounts or will set up payment plans for their patients who do not have insurance, but you will need to ask so you  aren't surprised when you get to your appointment.  2) Contact Your Local Health Department Not all health departments have doctors that can see patients for sick visits, but many do, so it is worth a call to see if yours does. If you don't know where your local health department is, you can check in your phone book. The CDC also has a tool to help you locate your state's health department, and many state websites also have listings of all of their local health departments.  3) Find a Walk-in Clinic If your illness is not likely to be very severe or complicated, you may want to try a walk in clinic. These are popping up all over the country in pharmacies, drugstores, and shopping centers. They're usually staffed by nurse practitioners or physician assistants that have been trained to treat common illnesses and complaints. They're usually fairly quick and inexpensive. However, if you have serious medical issues or chronic medical problems, these are probably not your best option.  No Primary Care Doctor: - Call Health Connect at  [phone] - they can help you locate a primary care doctor that  accepts your insurance, provides certain services, etc. - Physician Referral Service- [phone]  Chronic Pain Problems: Organization         Address  Phone   Notes  [facility]  [phone] Patients need to be referred by their primary care doctor.   Medication Assistance: Organization         Address  Phone   Notes  [facility] [address] [place], [place] [address] [phone]) [phone] --Must be a resident of [place] [facility] -- Must have NO insurance coverage whatsoever (no Medicaid/ Medicare, etc.) -- The pt. MUST have a primary care doctor that directs their care regularly and follows them in the community   MedAssist  [phone]   [employer]  [phone]    Agencies that provide inexpensive medical care: Organization          Address  Phone   Notes  [facility]  [phone]   [facility]    [phone]   [facility] [address] [place], [place] [address] [phone]   [facility] [address], [place] [phone]   [facility]anned Parenthood    [phone]   [facility]    [phone]   [facility] and [facility]  [address], [place] Phone:  [phone](336) [phone], Fax:  [phone] Hours of Operation:  9 am - 6 pm, M-F.  Also accepts Medicaid/Medicare and self-pay.  [facility]  [address], [place] Phone: [phone], Fax: [phone]. Hours of Operation:  8:30 am - 5:30 pm, M-F.  Also accepts Medicaid and self-pay.  [facility] [place] [address], [place] Phone: [phone]   [facility] [address], [place], [place] [phone], Ext. [phone] Mondays & Thursdays: 7-9 AM.  First 15 patients  are seen on a first come, first serve basis.    Medicaid-accepting [facility] Providers:  Organization         Address  Phone   Notes  [place] [address], [place] [phone] Also accepts self-pay patients.  [facility] [address] Laurell Josephs [place], [place]  [phone]   [facility] [address], [place] [phone]   [facility] [address] [place] [phone]   Renaye Rakers [address], [place]   [phone] Only accepts [place] Access [place] patients after they have their name applied to their card.   Self-Pay (no insurance) in [facility]:  Organization         Address  Phone   Notes  Sickle Cell Patients, [facility] [address] [place], [place] [phone]   [facility] [address] [place], [place] [phone]    [facility] [place]  [address], [place] [phone]   [facility]/Dr. Osei-Bonsu  [address], [place] or [address], [place] [phone] Phone number for both [place] and [place] locations is the same.  [facility]gent [facility] and [facility] [address] [place] [phone]   [facility] [place] [address] [place] or [address] [phone]   [facility] [address] [place] [phone], phone; [phone], fax Sees patients 1st and 3rd Saturday of every month.  Must not qualify for public or private insurance (i.e. Medicaid, Medicare, [place] Health Choice, Veterans' Benefits)  Household income should be no more than 200% of the poverty level The clinic cannot treat you if you are pregnant or think you are pregnant  Sexually transmitted diseases are not treated at the clinic.    Dental Care: Organization         Address  Phone  Notes  [facility] Department of [facility] [address] [place], [place] [phone] Accepts children up to age 41 who are enrolled in [place] or [place] Health Choice; pregnant women with a Medicaid card; and children who have applied for Medicaid or [place] Health Choice, but were declined, whose parents can pay a reduced fee at time of service.  [facility] of [facility]  [address], [place] [phone] Accepts children up to age 73 who are enrolled in [place] or [place] Health Choice; pregnant women with a Medicaid card; and children who have applied for Medicaid or [place] Health Choice, but were declined, whose parents can pay a reduced fee at time of service.  [facility]  [address] [place], [place] [phone] Patients are seen by appointment only. Walk-ins are not accepted. [facility] will see patients 63  years of age and older. Monday - Tuesday (8am-5pm) Most Wednesdays (8:30-5pm) $30 per visit, cash only  [facility] Access PRO[facility]  [address], [facility] [phone] Patients are seen by appointment only. Walk-ins are not accepted. [facility] will see patients 36 years of age and older. One Wednesday Evening (Monthly: Volunteer Based).  $30 per visit, cash only  [employer] of [employer]  [phone] for adults; Children under age 77, call Graduate Pediatric Dentistry at [phone]. Children aged 11-14, please call [phone] to request a pediatric application.  Dental services are provided in all areas of dental care including fillings, crowns and bridges, complete and partial dentures, implants, gum treatment, root canals, and extractions. Preventive care is also provided. Treatment is provided to both adults and children. Patients are selected via a lottery and there is often a waiting list.   [facility] [address] [place]  [phone] www.[email or web address]   [facility] [address] [place], [place] [phone], Ext. [phone] Second and Fourth Thursday of each month, opens at 6:30 AM; Clinic ends at 9 AM.  Patients are seen on a first-come first-served basis, and a limited number are seen during each clinic.   [facility]  [address] Hillard Danker [place], [place] [phone]   Eligibility Requirements You must have lived in [place], [place], or [place] counties for at least the last three months.   You cannot be eligible for state or federal sponsored [employer], including [employer], [place], or [employer].   You generally cannot be eligible for healthcare insurance through your employer.    How to apply: Eligibility screenings are held every Tuesday and Wednesday afternoon from 1:00 pm until 4:00 pm. You do not need an appointment for the interview!  [facility]  [address] [place], [place]   [place] Department  [place]  [phone]    Behavioral Health Resources in the Community: Intensive Outpatient Programs Organization         Address  Phone  Notes  [place]. [address] [place], [place] [phone]   [facility]patient [address], [place], [place]   ADS: [facility] [address] [place], [place]   [place] [address]  [place], [place] or [phone]   Substance Abuse Resources Organization         Address  Phone  Notes  [facility] Drug Services  [phone])[phone]   [place]  [phone]   The [place]   Chinita Pester  [phone]   Residential & Outpatient Substance Abuse Program  [phone]   Psychological Services Organization         Address  Phone  Notes  [facility] [place]  [place]  [phone]   [place] [address], [place] or [phone]    Mobile Crisis Teams Organization         Address  Phone  Notes  [facility], Mobile Crisis Care Unit  [phone]   [facility]  [address] [place], [place]   [place] [address], [place] [phone]    Self-Help/Support Groups Organization         Address  Phone             Notes  [place] of [place] - variety of support groups  [place] Call for more information  Narcotics Anonymous (NA), [facility] [address], [place]  2 meetings at this location   Brewing technologist  Notes  [facility] [place],    [place]  [phone]   °[facility] ° [address], [place], [place] [phone]   °[facility] [address], [place] [phone] Admissions: 8am-3pm M-F  °[facility] [address]    °[place], [place] [phone]   °[facility] [address], [place], [place] [phone]   °[facility] [address]  °[place], [place] [phone]   °[facility] - Intensive Outpatient [address], [place], [place] [phone]   °[facility] [address]  °[place], [place] [phone] or [phone]   °[facility] ([facility]) [address] [place], [place] [phone] Accepts Medicaid  °[facility] [address]  °[place] 1-800-659-3381 Substance Abuse/Addiction Treatment  ° °[place] [facility] Resources °Organization         Address  Phone  Notes  °[facility]  ([phone]) [phone]   °Julie Brannon, PhD [address] [facility], [place]   ([phone]) [phone] or (336) [phone]   ° Behavioral   [address] °[place], [place] ([phone]) [phone]   °[facility] [address], [place], [place] ([phone]) [phone] Insurance/Medicaid/sponsorship through [facility]  °[facility] [address]                                    [place], [place] ([phone]) [phone] Therapy/tele-psych/case  °[facility] [address]  ° [place], [place] ([phone]) [phone]    °Dr. Arfeen  ([phone]) [phone]   °[facility] [place]  [employer] [place] [facility] 1) [address], [place] °2) [address], [place] °3)  [address], [place] ([phone]) [phone] °(336) [phone] ° °(336) [phone]   °[place] Child Abuse Hotline (336) 342-1394 or (336) [phone] (After Hours)    ° ° °

## 2014-03-01 NOTE — ED Notes (Signed)
Patient snoring loudly at this time. No distress noted. Sitter at bedside.

## 2014-03-01 NOTE — Consult Note (Signed)
Telepsych Consultation   Reason for Consult:  Patient disposition Referring Physician:  Eulis Foster MD Aaron Coffey is an 38 y.o. male.  Assessment: AXIS I:  Adjustment Disorder with Mixed Disturbance of Emotions and Conduct and Intentional self harm inflicted by knife AXIS II:  Cluster B Traits AXIS III:  Tremors, Obesity HLD, DM2 Past Medical History  Diagnosis Date  . Occasional tremors    AXIS IV:  other psychosocial or environmental problems AXIS V:  41-50 serious symptoms  Plan:  Patient does not meet criteria for psychiatric inpatient admission. Will need to collaborate additional information with his mother who should agree with the plan to pursue OP resources.  Subjective:   Aaron Coffey is a 38 y.o. male patient admitted voluntarily and accompanied by GPD. Patient reportedly wanted a friend to come over to his house, where he resides with his mother and another sibling. Reportedly his mother was against him having company, thus the patient branded a knife against his neck. The patient is denying nay SI/SA/HI or AVH. Patient states that he was feeling very stressed and was making a gesture in order to upset his mother. The patient was admitted to [facility] in 2011 under similar circumstances but no record of OP follow up with therapist or psychiatric services. The patient is denying any prior psych history, OP recourses or use of psychotropics at this time. The patient is denying any hx of PTSD, Anxiety d/o, Panic attacks, MDD, Bipolar d/o or psychosis. Patient is also denying cutting. The patient is a non smoker or user of illicit drugs but drinks socially on the weekends. 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   HPI Elements:     Location: agitation, impulsive actions Quality: agitation Severity: moderate Timing: last 24 hours Duration: acute Context: agitation, gesturing self harm with poor insight  Past  Psychiatric History: Past Medical History  Diagnosis Date  . Occasional tremors     reports that he has never smoked. He does not have any smokeless tobacco history on file. He reports that he drinks alcohol. He reports that he does not use illicit drugs. No family history on file.       Allergies:  No Known Allergies  ACT Assessment Complete:  No:   Past Psychiatric History: Diagnosis:  Adjustment d/o with mixed disturbances of conduct and emotions  Hospitalizations:  Yes 2011  Outpatient Care:  no  Substance Abuse Care:  no  Self-Mutilation:  no  Suicidal Attempts:  no  Homicidal Behaviors:  no   Violent Behaviors:  yes   Place of Residence:  unknown Marital Status:  single Employed/Unemployed:  unemployed Education:  12 th grade Family Supports:  yes Objective: Blood pressure 123/79, pulse 88, temperature 98 F (36.7 C), temperature source Oral, resp. rate 20, height 5' 7"  (1.702 m), weight 102.513 kg (226 lb), SpO2 99 %.Body mass index is 35.39 kg/(m^2). Results for orders placed or performed during the hospital encounter of 02/28/14 (from the past 72 hour(s))  CBC WITH DIFFERENTIAL     Status: None   Collection Time: 02/28/14  7:57 PM  Result Value Ref Range   WBC 9.1 4.0 - 10.5 K/uL   RBC 5.60 4.22 - 5.81 MIL/uL   Hemoglobin 16.3 13.0 - 17.0 g/dL   HCT 45.9 39.0 - 52.0 %   MCV 82.0 78.0 - 100.0 fL   MCH 29.1 26.0 - 34.0 pg   MCHC 35.5 30.0 -  36.0 g/dL   RDW 13.3 11.5 - 15.5 %   Platelets 302 150 - 400 K/uL   Neutrophils Relative % 60 43 - 77 %   Neutro Abs 5.4 1.7 - 7.7 K/uL   Lymphocytes Relative 30 12 - 46 %   Lymphs Abs 2.8 0.7 - 4.0 K/uL   Monocytes Relative 9 3 - 12 %   Monocytes Absolute 0.8 0.1 - 1.0 K/uL   Eosinophils Relative 1 0 - 5 %   Eosinophils Absolute 0.1 0.0 - 0.7 K/uL   Basophils Relative 0 0 - 1 %   Basophils Absolute 0.0 0.0 - 0.1 K/uL  Comprehensive metabolic panel     Status: Abnormal   Collection Time: 02/28/14  7:57 PM  Result Value  Ref Range   Sodium 140 137 - 147 mEq/L   Potassium 3.3 (L) 3.7 - 5.3 mEq/L   Chloride 99 96 - 112 mEq/L   CO2 23 19 - 32 mEq/L   Glucose, Bld 118 (H) 70 - 99 mg/dL   BUN 11 6 - 23 mg/dL   Creatinine, Ser 0.92 0.50 - 1.35 mg/dL   Calcium 10.2 8.4 - 10.5 mg/dL   Total Protein 8.2 6.0 - 8.3 g/dL   Albumin 4.7 3.5 - 5.2 g/dL   AST 29 0 - 37 U/L   ALT 35 0 - 53 U/L   Alkaline Phosphatase 74 39 - 117 U/L   Total Bilirubin 0.6 0.3 - 1.2 mg/dL   GFR calc non Af Amer >90 >90 mL/min   GFR calc Af Amer >90 >90 mL/min    Comment: (NOTE) The eGFR has been calculated using the CKD EPI equation. This calculation has not been validated in all clinical situations. eGFR's persistently <90 mL/min signify possible Chronic Kidney Disease.    Anion gap 18 (H) 5 - 15  Ethanol     Status: None   Collection Time: 02/28/14  7:57 PM  Result Value Ref Range   Alcohol, Ethyl (B) <11 0 - 11 mg/dL    Comment:        LOWEST DETECTABLE LIMIT FOR SERUM ALCOHOL IS 11 mg/dL FOR MEDICAL PURPOSES ONLY   Drug screen panel, emergency     Status: Abnormal   Collection Time: 02/28/14 10:36 PM  Result Value Ref Range   Opiates NONE DETECTED NONE DETECTED   Cocaine NONE DETECTED NONE DETECTED   Benzodiazepines NONE DETECTED NONE DETECTED   Amphetamines NONE DETECTED NONE DETECTED   Tetrahydrocannabinol NONE DETECTED NONE DETECTED   Barbiturates POSITIVE (A) NONE DETECTED    Comment:        DRUG SCREEN FOR MEDICAL PURPOSES ONLY.  IF CONFIRMATION IS NEEDED FOR ANY PURPOSE, NOTIFY LAB WITHIN 5 DAYS.        LOWEST DETECTABLE LIMITS FOR URINE DRUG SCREEN Drug Class       Cutoff (ng/mL) Amphetamine      1000 Barbiturate      200 Benzodiazepine   496 Tricyclics       759 Opiates          300 Cocaine          300 THC              50   Urinalysis, Routine w reflex microscopic     Status: Abnormal   Collection Time: 02/28/14 10:36 PM  Result Value Ref Range   Color, Urine YELLOW YELLOW   APPearance CLEAR  CLEAR   Specific Gravity, Urine >1.030 (H) 1.005 - 1.030  pH 5.5 5.0 - 8.0   Glucose, UA NEGATIVE NEGATIVE mg/dL   Hgb urine dipstick NEGATIVE NEGATIVE   Bilirubin Urine SMALL (A) NEGATIVE   Ketones, ur NEGATIVE NEGATIVE mg/dL   Protein, ur NEGATIVE NEGATIVE mg/dL   Urobilinogen, UA 0.2 0.0 - 1.0 mg/dL   Nitrite NEGATIVE NEGATIVE   Leukocytes, UA NEGATIVE NEGATIVE    Comment: MICROSCOPIC NOT DONE ON URINES WITH NEGATIVE PROTEIN, BLOOD, LEUKOCYTES, NITRITE, OR GLUCOSE <1000 mg/dL.   Labs are reviewed and are pertinent for no critical lab values noted  No current facility-administered medications for this encounter.   Current Outpatient Prescriptions  Medication Sig Dispense Refill  . Omega-3 Fatty Acids (FISH OIL PO) Take 1 capsule by mouth daily.    . primidone (MYSOLINE) 50 MG tablet Take 100 mg by mouth 2 (two) times daily.    . simvastatin (ZOCOR) 20 MG tablet Take 20 mg by mouth every evening.    . trihexyphenidyl (ARTANE) 2 MG tablet Take 4 mg by mouth 2 (two) times daily.      Psychiatric Specialty Exam:     Blood pressure 123/79, pulse 88, temperature 98 F (36.7 C), temperature source Oral, resp. rate 20, height 5' 7"  (1.702 m), weight 102.513 kg (226 lb), SpO2 99 %.Body mass index is 35.39 kg/(m^2).  General Appearance: Casual  Eye Contact::  Fair  Speech:  Normal Rate  Volume:  Normal  Mood:  Anxious  Affect:  Congruent  Thought Process:  Circumstantial  Orientation:  Full (Time, Place, and Person)  Thought Content:  Negative  Suicidal Thoughts:  No  Homicidal Thoughts:  No  Memory:  Immediate;   Fair  Judgement:  Poor  Insight:  Lacking  Psychomotor Activity:  Negative  Concentration:  Fair  Recall:  Fair  Akathisia:  Negative  Handed:  Right  AIMS (if indicated):     Assets:  Communication Skills  Sleep:      Treatment Plan Summary: Patient is not meeting IP criteria for crises mgmt, safety and stabilization. Will need to collaborate with patients  mother who should agree to pursue OP resources and the patient to sign a no harm contract   Disposition:    SIMON,SPENCER E 03/01/2014 3:45 AM  Agree with plan for no harm contract and collaboration for outpatient services.

## 2014-03-01 NOTE — ED Provider Notes (Signed)
06:28- Pt is calm and comfortable. He states that he is not suicidal.  He threatened to hurt himself earlier, "because I was mad."  He is willing to sign a no harm contract.  He feels like he can go home to live with his mother and that he will not cause problems while there.  I discussed the situation with his mother, Aaron Coffey.  She states that she will come and get Aaron Coffey and take him home.  The patient will be given referrals for follow-up psychiatric care.  Flint MelterElliott L Ivo Moga, MD 03/01/14 [phone]

## 2014-03-01 NOTE — BH Assessment (Signed)
Spoke with TTS Aaron Coffey who faxed no harm contract and OP resources to be provided to patient prior to d/c. Spoke with pt's ED nurse Aaron Coffey whom is in agreement with ensuring that pt receives OP resources, per nurse pt has already signed no harm contract and is in agreement with terms of contract.   Aaron PeachNajah Anjeli Casad, MS, LCASA Assessment Counselor

## 2014-03-01 NOTE — ED Notes (Signed)
Called pts mother, she stated that it would be at least another hour before she could find a ride to pick patient up, stating that she does not drive.

## 2014-03-01 NOTE — ED Notes (Signed)
Pt signed no harm contract earlier in shift. After learning that his mother would be another hour, pt stated that he would rather be d/c to waiting area to wait on ride home. Pt stated he has no desire of harming himself or anyone else.

## 2014-03-01 NOTE — ED Notes (Signed)
When day shift nurse took report, pt had signed no-harm contract with night shift RN as witness.

## 2014-06-23 DIAGNOSIS — R251 Tremor, unspecified: Secondary | ICD-10-CM | POA: Diagnosis not present

## 2014-10-27 DIAGNOSIS — Z6832 Body mass index (BMI) 32.0-32.9, adult: Secondary | ICD-10-CM | POA: Diagnosis not present

## 2014-10-27 DIAGNOSIS — E782 Mixed hyperlipidemia: Secondary | ICD-10-CM | POA: Diagnosis not present

## 2014-10-27 DIAGNOSIS — E6609 Other obesity due to excess calories: Secondary | ICD-10-CM | POA: Diagnosis not present

## 2014-10-27 DIAGNOSIS — Z1389 Encounter for screening for other disorder: Secondary | ICD-10-CM | POA: Diagnosis not present

## 2015-03-09 DIAGNOSIS — R7301 Impaired fasting glucose: Secondary | ICD-10-CM | POA: Diagnosis not present

## 2015-03-09 DIAGNOSIS — G250 Essential tremor: Secondary | ICD-10-CM | POA: Diagnosis not present

## 2015-03-09 DIAGNOSIS — E6609 Other obesity due to excess calories: Secondary | ICD-10-CM | POA: Diagnosis not present

## 2015-03-09 DIAGNOSIS — Z6833 Body mass index (BMI) 33.0-33.9, adult: Secondary | ICD-10-CM | POA: Diagnosis not present

## 2015-03-09 DIAGNOSIS — E782 Mixed hyperlipidemia: Secondary | ICD-10-CM | POA: Diagnosis not present

## 2015-03-09 DIAGNOSIS — Z0000 Encounter for general adult medical examination without abnormal findings: Secondary | ICD-10-CM | POA: Diagnosis not present

## 2015-03-09 DIAGNOSIS — Z1389 Encounter for screening for other disorder: Secondary | ICD-10-CM | POA: Diagnosis not present

## 2015-03-13 DIAGNOSIS — Z0000 Encounter for general adult medical examination without abnormal findings: Secondary | ICD-10-CM | POA: Diagnosis not present

## 2015-04-08 ENCOUNTER — Emergency Department (HOSPITAL_COMMUNITY)
Admission: EM | Admit: 2015-04-08 | Discharge: 2015-04-08 | Disposition: A | Discharge status: Home / Self Care | Payer: Medicare Other | Attending: Emergency Medicine | Admitting: Emergency Medicine

## 2015-04-08 ENCOUNTER — Encounter (HOSPITAL_COMMUNITY): Payer: Self-pay

## 2015-04-08 DIAGNOSIS — Z79899 Other long term (current) drug therapy: Secondary | ICD-10-CM | POA: Diagnosis not present

## 2015-04-08 DIAGNOSIS — F1310 Sedative, hypnotic or anxiolytic abuse, uncomplicated: Secondary | ICD-10-CM | POA: Diagnosis not present

## 2015-04-08 DIAGNOSIS — F329 Major depressive disorder, single episode, unspecified: Secondary | ICD-10-CM | POA: Diagnosis present

## 2015-04-08 DIAGNOSIS — E669 Obesity, unspecified: Secondary | ICD-10-CM | POA: Insufficient documentation

## 2015-04-08 DIAGNOSIS — F419 Anxiety disorder, unspecified: Secondary | ICD-10-CM | POA: Diagnosis not present

## 2015-04-08 DIAGNOSIS — F418 Other specified anxiety disorders: Secondary | ICD-10-CM | POA: Diagnosis not present

## 2015-04-08 LAB — CBC WITH DIFFERENTIAL/PLATELET
BASOS PCT: 0 %
Basophils Absolute: 0 10*3/uL (ref 0.0–0.1)
EOS ABS: 0.3 10*3/uL (ref 0.0–0.7)
EOS PCT: 3 %
HCT: 45 % (ref 39.0–52.0)
Hemoglobin: 15.8 g/dL (ref 13.0–17.0)
Lymphocytes Relative: 23 %
Lymphs Abs: 2.1 10*3/uL (ref 0.7–4.0)
MCH: 28.8 pg (ref 26.0–34.0)
MCHC: 35.1 g/dL (ref 30.0–36.0)
MCV: 82 fL (ref 78.0–100.0)
MONO ABS: 0.5 10*3/uL (ref 0.1–1.0)
MONOS PCT: 6 %
Neutro Abs: 6.4 10*3/uL (ref 1.7–7.7)
Neutrophils Relative %: 68 %
PLATELETS: 280 10*3/uL (ref 150–400)
RBC: 5.49 MIL/uL (ref 4.22–5.81)
RDW: 13.3 % (ref 11.5–15.5)
WBC: 9.3 10*3/uL (ref 4.0–10.5)

## 2015-04-08 LAB — COMPREHENSIVE METABOLIC PANEL WITH GFR
ALT: 31 U/L (ref 17–63)
AST: 25 U/L (ref 15–41)
Albumin: 4.7 g/dL (ref 3.5–5.0)
Alkaline Phosphatase: 68 U/L (ref 38–126)
Anion gap: 11 (ref 5–15)
BUN: 8 mg/dL (ref 6–20)
CO2: 22 mmol/L (ref 22–32)
Calcium: 9.7 mg/dL (ref 8.9–10.3)
Chloride: 106 mmol/L (ref 101–111)
Creatinine, Ser: 0.9 mg/dL (ref 0.61–1.24)
GFR calc Af Amer: >60 mL/min
GFR calc non Af Amer: >60 mL/min
Glucose, Bld: 137 mg/dL — ABNORMAL HIGH (ref 65–99)
Potassium: 3.4 mmol/L — ABNORMAL LOW (ref 3.5–5.1)
Sodium: 139 mmol/L (ref 135–145)
Total Bilirubin: 1 mg/dL (ref 0.3–1.2)
Total Protein: 7.8 g/dL (ref 6.5–8.1)

## 2015-04-08 LAB — URINE MICROSCOPIC-ADD ON
RBC / HPF: NONE SEEN RBC/hpf (ref 0–5)
WBC UA: NONE SEEN WBC/hpf (ref 0–5)

## 2015-04-08 LAB — ETHANOL: Alcohol, Ethyl (B): <5 mg/dL

## 2015-04-08 LAB — RAPID URINE DRUG SCREEN, HOSP PERFORMED
Amphetamines: NOT DETECTED
BARBITURATES: POSITIVE — AB
Benzodiazepines: NOT DETECTED
COCAINE: NOT DETECTED
OPIATES: NOT DETECTED
Tetrahydrocannabinol: NOT DETECTED

## 2015-04-08 LAB — URINALYSIS, ROUTINE W REFLEX MICROSCOPIC
Bilirubin Urine: NEGATIVE
GLUCOSE, UA: NEGATIVE mg/dL
Hgb urine dipstick: NEGATIVE
Ketones, ur: NEGATIVE mg/dL
LEUKOCYTES UA: NEGATIVE
Nitrite: NEGATIVE
Specific Gravity, Urine: >1.03 — ABNORMAL HIGH (ref 1.005–1.030)
pH: 5.5 (ref 5.0–8.0)

## 2015-04-08 MED ORDER — ACETAMINOPHEN 325 MG PO TABS: 650.0000 mg | ORAL_TABLET | ORAL | Status: DC | PRN

## 2015-04-08 MED ORDER — ONDANSETRON HCL 4 MG PO TABS: 4.0000 mg | ORAL_TABLET | Freq: Three times a day (TID) | ORAL | Status: DC | PRN

## 2015-04-08 MED ORDER — SIMVASTATIN 10 MG PO TABS: 20.0000 mg | ORAL_TABLET | Freq: Every evening | ORAL | Status: DC

## 2015-04-08 MED ORDER — TRIHEXYPHENIDYL HCL 2 MG PO TABS
4.0000 mg | ORAL_TABLET | Freq: Two times a day (BID) | ORAL | Status: DC
  Administered 2015-04-08: 4 mg via ORAL
  Filled 2015-04-08 (×5): qty 2

## 2015-04-08 MED ORDER — ZOLPIDEM TARTRATE 5 MG PO TABS: 5.0000 mg | ORAL_TABLET | Freq: Every evening | ORAL | Status: DC | PRN

## 2015-04-08 MED ORDER — ALUM & MAG HYDROXIDE-SIMETH 200-200-20 MG/5ML PO SUSP: 30.0000 mL | ORAL | Status: DC | PRN

## 2015-04-08 MED ORDER — NICOTINE 21 MG/24HR TD PT24: 21.0000 mg | MEDICATED_PATCH | Freq: Every day | TRANSDERMAL | Status: DC | PRN

## 2015-04-08 MED ORDER — IBUPROFEN 400 MG PO TABS: 600.0000 mg | ORAL_TABLET | Freq: Three times a day (TID) | ORAL | Status: DC | PRN

## 2015-04-08 MED ORDER — PRIMIDONE 50 MG PO TABS
100.0000 mg | ORAL_TABLET | Freq: Two times a day (BID) | ORAL | Status: DC
  Administered 2015-04-08: 100 mg via ORAL
  Filled 2015-04-08 (×5): qty 2

## 2015-04-08 NOTE — ED Notes (Signed)
Pt reports lives with his mother and has been arguing with her a lot lately and is feeling depressed.  Denies si or HI.

## 2015-04-08 NOTE — ED Notes (Signed)
Belongings returned to patient. Patient with no complaints at this time. Respirations even and unlabored. Skin warm/dry. Discharge instructions reviewed with patient at this time. Patient given opportunity to voice concerns/ask questions. Patient discharged at this time and left Emergency Department with steady gait.

## 2015-04-08 NOTE — BH Assessment (Signed)
Assessment Note  Aaron Coffey is an 40 y.o. male. Patient reports he was brought into the ED by police because altercation with mother.  Patient currently denies SI/HI, A/VH, and other self injurious behaviors.  Patient reports having an argument with his mother over mother and his friends that resulted in him throwing things and being belligerent.    This Clinical research associate consulted with [name], NP it is recommended to refer for outpatient. Patient was informed to follow up with [facility] walk-ins 8 a.m-4 p.m Monday-Friday.    Diagnosis: Adjustment disorder with mixed disturbance of emotions and conduct   Past Medical History:  Past Medical History  Diagnosis Date  . Occasional tremors     Past Surgical History  Procedure Laterality Date  . Cholecystectomy      Family History: No family history on file.  Social History:  reports that he has never smoked. He does not have any smokeless tobacco history on file. He reports that he drinks alcohol. He reports that he does not use illicit drugs.  Additional Social History:  Alcohol / Drug Use Pain Medications: see chart Prescriptions: see chart Over the Counter: see chart History of alcohol / drug use?: No history of alcohol / drug abuse  CIWA: CIWA-Ar BP: 162/96 mmHg Pulse Rate: 105 COWS:    Allergies: No Known Allergies  Home Medications:  (Not in a hospital admission)  OB/GYN Status:  No LMP for male patient.  General Assessment Data Location of Assessment: [facility] TTS Assessment: In system Is this a Tele or Face-to-Face Assessment?: Tele Assessment Is this an Initial Assessment or a Re-assessment for this encounter?: Initial Assessment Marital status: Single Maiden name: na Is patient pregnant?: No Pregnancy Status: No Living Arrangements: Parent Can pt return to current living arrangement?: Yes Admission Status: Voluntary Is patient capable of signing voluntary admission?: Yes Referral Source: Self/Family/Friend Insurance  type: MCD  Medical Screening Exam [facility] Walk-in ONLY) Medical Exam completed: Yes  Crisis Care Plan Living Arrangements: Parent Name of Psychiatrist: none Name of Therapist: none  Education Status Is patient currently in school?: No Current Grade: na Highest grade of school patient has completed: 12 Name of school: na Contact person: na  Risk to self with the past 6 months Suicidal Ideation: No-Not Currently/Within Last 6 Months Has patient been a risk to self within the past 6 months prior to admission? : No Suicidal Intent: No-Not Currently/Within Last 6 Months Has patient had any suicidal intent within the past 6 months prior to admission? : No Is patient at risk for suicide?: No Suicidal Plan?: No-Not Currently/Within Last 6 Months Has patient had any suicidal plan within the past 6 months prior to admission? : No Access to Means: No What has been your use of drugs/alcohol within the last 12 months?: denies Previous Attempts/Gestures: No How many times?: 0 Other Self Harm Risks: na Intentional Self Injurious Behavior: None Family Suicide History: No Recent stressful life event(s): Conflict (Comment), Loss (Comment) Persecutory voices/beliefs?: No Depression: Yes Depression Symptoms: Guilt Substance abuse history and/or treatment for substance abuse?: Yes  Risk to Others within the past 6 months Homicidal Ideation: No-Not Currently/Within Last 6 Months Does patient have any lifetime risk of violence toward others beyond the six months prior to admission? : No Thoughts of Harm to Others: No-Not Currently Present/Within Last 6 Months Current Homicidal Intent: No-Not Currently/Within Last 6 Months Current Homicidal Plan: No-Not Currently/Within Last 6 Months Access to Homicidal Means: No Identified Victim: na History of harm to  others?: No Assessment of Violence: None Noted Violent Behavior Description: na Does patient have access to weapons?: No Criminal Charges  Pending?: No Does patient have a court date: No Is patient on probation?: No  Psychosis Hallucinations: None noted Delusions: None noted  Mental Status Report Appearance/Hygiene: In hospital gown Eye Contact: Fair Motor Activity: Freedom of movement Speech: Logical/coherent Level of Consciousness: Alert Mood: Depressed Affect: Sad Anxiety Level: None Thought Processes: Relevant Judgement: Unimpaired Orientation: Person, Place, Time, Situation, Appropriate for developmental age Obsessive Compulsive Thoughts/Behaviors: None  Cognitive Functioning Concentration: Normal Memory: Recent Intact, Remote Intact IQ: Average Insight: Fair Impulse Control: Fair Appetite: Fair Weight Loss: 0 Weight Gain: 0 Sleep: No Change Total Hours of Sleep: 5 Vegetative Symptoms: None  ADLScreening [facility] Assessment Services) Patient's cognitive ability adequate to safely complete daily activities?: Yes Patient able to express need for assistance with ADLs?: Yes Independently performs ADLs?: Yes (appropriate for developmental age)     Prior Outpatient Therapy Does patient have an ACCT team?: No Does patient have Intensive In-House Services?  : No Does patient have [facility] services? : No Does patient have P4CC services?: No  ADL Screening (condition at time of admission) Patient's cognitive ability adequate to safely complete daily activities?: Yes Patient able to express need for assistance with ADLs?: Yes Independently performs ADLs?: Yes (appropriate for developmental age)       Abuse/Neglect Assessment (Assessment to be complete while patient is alone) Physical Abuse: Denies Verbal Abuse: Denies Sexual Abuse: Denies Exploitation of patient/patient's resources: Denies Self-Neglect: Denies Values / Beliefs Cultural Requests During Hospitalization: None Spiritual Requests During Hospitalization: None Consults Spiritual Care Consult Needed: No Social Work Consult Needed:  No Merchant navy officerAdvance Directives (For Healthcare) Does patient have an advance directive?: No    Additional Information 1:1 In Past 12 Months?: No CIRT Risk: No Elopement Risk: No Does patient have medical clearance?: Yes     Disposition:  Disposition Initial Assessment Completed for this Encounter: Yes Disposition of Patient: Outpatient treatment Type of outpatient treatment: Adult  On Site Evaluation by:   Reviewed with Physician:    Maryelizabeth Rowanorbett, Raysha Tilmon A 04/08/2015 3:45 PM

## 2015-04-08 NOTE — ED Provider Notes (Signed)
CSN: [id number]     Arrival date & time 04/08/15  1322 History   First MD Initiated Contact with Patient 04/08/15 1340     Chief Complaint  Patient presents with  . Depression     (Consider location/radiation/quality/duration/timing/severity/associated sxs/prior Treatment) HPI   Aaron Coffey is a 40 y.o. male who presents for evaluation of feeling depressed. He states that he has been tearful and sad for 2 days. He denies suicidal or homicidal ideation. He spent the night with some friends last night. When he got home today, began arguing with his mother and breaking things in his bedroom. At that point she called the police who brought him here for evaluation. He states he is not seeing a therapist. He was evaluated for suicidal ideation about one year ago on, discharge and recommended for outpatient follow-up. He states that he did not follow-up with anybody that time. He did not take his usual medications, this morning. He uses Mysoline, for a tremor. He is on Artane but denies having a psychiatrist. He denies recent illnesses, including fever, chills, nausea, vomiting, cough, chest pain, weakness or dizziness. There are no other known modifying factors.   Past Medical History  Diagnosis Date  . Occasional tremors    Past Surgical History  Procedure Laterality Date  . Cholecystectomy     No family history on file. Social History  Substance Use Topics  . Smoking status: Never Smoker   . Smokeless tobacco: None  . Alcohol Use: Yes     Comment: occ    Review of Systems  All other systems reviewed and are negative.     Allergies  Review of patient's allergies indicates no known allergies.  Home Medications   Prior to Admission medications   Medication Sig Start Date End Date Taking? Authorizing Provider  Omega-3 Fatty Acids (FISH OIL PO) Take 1 capsule by mouth daily.    Historical Provider, MD  primidone (MYSOLINE) 50 MG tablet Take 100 mg by mouth 2 (two) times  daily. 01/10/14   Historical Provider, MD  simvastatin (ZOCOR) 20 MG tablet Take 20 mg by mouth every evening. 01/10/14   Historical Provider, MD  trihexyphenidyl (ARTANE) 2 MG tablet Take 4 mg by mouth 2 (two) times daily. 01/10/14   Historical Provider, MD   BP 162/96 mmHg  Pulse 105  Temp(Src) 98.6 F (37 C) (Oral)  Resp 20  Wt 222 lb 8 oz (100.925 kg)  SpO2 99% Physical Exam  Constitutional: He is oriented to person, place, and time. He appears well-developed. He appears distressed (he is uncomfortable. He exhibits poor eye contact.).  Obese  HENT:  Head: Normocephalic and atraumatic.  Right Ear: External ear normal.  Left Ear: External ear normal.  Eyes: Conjunctivae and EOM are normal. Pupils are equal, round, and reactive to light.  Neck: Normal range of motion and phonation normal. Neck supple.  Cardiovascular: Normal rate, regular rhythm and normal heart sounds.   Pulmonary/Chest: Effort normal and breath sounds normal. He exhibits no bony tenderness.  Abdominal: Soft. There is no tenderness.  Musculoskeletal: Normal range of motion.  Neurological: He is alert and oriented to person, place, and time. No cranial nerve deficit or sensory deficit. He exhibits normal muscle tone. Coordination normal.  Skin: Skin is warm, dry and intact.  Psychiatric: His behavior is normal. Judgment and thought content normal.  Depressed, tearful.  Nursing note and vitals reviewed.   ED Course  Procedures (including critical care time) Medications - No data  to display  Patient Vitals for the past 24 hrs:  BP Temp Temp src Pulse Resp SpO2 Weight  04/08/15 1335 162/96 mmHg 98.6 F (37 C) Oral 105 20 99 % 222 lb 8 oz (100.925 kg)   15:40- discussed with TTS, they state that the patient does not have indicators for admission at this time.  3:55 PM Reevaluation with update and discussion. After initial assessment and treatment, an updated evaluation reveals he is comfortable now. No  additional complaints. He states that he plans on going to [facility], next week. Letica Giaimo L    Labs Review Labs Reviewed - No data to display  Imaging Review No results found. I have personally reviewed and evaluated these images and lab results as part of my medical decision-making.   EKG Interpretation None      MDM   Final diagnoses:  Situational anxiety    Situational anxiety, with nonspecified behavioral disorder. He is not suicidal or homicidal. Doubt unstable psychiatric situation at this time.  Nursing Notes Reviewed/ Care Coordinated Applicable Imaging Reviewed Interpretation of Laboratory Data incorporated into ED treatment  The patient appears reasonably screened and/or stabilized for discharge and I doubt any other medical condition or other [facility] requiring further screening, evaluation, or treatment in the ED at this time prior to discharge.  Plan: Home Medications- usual; Home Treatments- rest; return here if the recommended treatment, does not improve the symptoms; Recommended follow up- Psychiatry f/u asap. Return here prn     Mancel BaleElliott Demecia Northway, MD 04/08/15 1559

## 2015-06-29 DIAGNOSIS — G249 Dystonia, unspecified: Secondary | ICD-10-CM | POA: Diagnosis not present

## 2015-06-29 DIAGNOSIS — E119 Type 2 diabetes mellitus without complications: Secondary | ICD-10-CM | POA: Diagnosis not present

## 2015-06-29 DIAGNOSIS — Z79899 Other long term (current) drug therapy: Secondary | ICD-10-CM | POA: Diagnosis not present

## 2015-06-29 DIAGNOSIS — E785 Hyperlipidemia, unspecified: Secondary | ICD-10-CM | POA: Diagnosis not present

## 2015-06-29 DIAGNOSIS — R251 Tremor, unspecified: Secondary | ICD-10-CM | POA: Diagnosis not present

## 2015-10-31 DIAGNOSIS — G252 Other specified forms of tremor: Secondary | ICD-10-CM | POA: Diagnosis not present

## 2015-11-03 DIAGNOSIS — Z6832 Body mass index (BMI) 32.0-32.9, adult: Secondary | ICD-10-CM | POA: Diagnosis not present

## 2015-11-03 DIAGNOSIS — E782 Mixed hyperlipidemia: Secondary | ICD-10-CM | POA: Diagnosis not present

## 2015-11-03 DIAGNOSIS — Z1389 Encounter for screening for other disorder: Secondary | ICD-10-CM | POA: Diagnosis not present

## 2015-11-03 DIAGNOSIS — R7309 Other abnormal glucose: Secondary | ICD-10-CM | POA: Diagnosis not present

## 2016-11-12 DIAGNOSIS — R251 Tremor, unspecified: Secondary | ICD-10-CM | POA: Diagnosis not present

## 2016-11-12 DIAGNOSIS — G249 Dystonia, unspecified: Secondary | ICD-10-CM | POA: Diagnosis not present

## 2016-11-12 DIAGNOSIS — Z79899 Other long term (current) drug therapy: Secondary | ICD-10-CM | POA: Diagnosis not present

## 2016-12-09 DIAGNOSIS — E781 Pure hyperglyceridemia: Secondary | ICD-10-CM | POA: Diagnosis not present

## 2016-12-09 DIAGNOSIS — M545 Low back pain: Secondary | ICD-10-CM | POA: Diagnosis not present

## 2016-12-09 DIAGNOSIS — Z1389 Encounter for screening for other disorder: Secondary | ICD-10-CM | POA: Diagnosis not present

## 2016-12-09 DIAGNOSIS — Z6831 Body mass index (BMI) 31.0-31.9, adult: Secondary | ICD-10-CM | POA: Diagnosis not present

## 2016-12-09 DIAGNOSIS — E6609 Other obesity due to excess calories: Secondary | ICD-10-CM | POA: Diagnosis not present

## 2016-12-09 DIAGNOSIS — R7309 Other abnormal glucose: Secondary | ICD-10-CM | POA: Diagnosis not present

## 2016-12-09 DIAGNOSIS — R319 Hematuria, unspecified: Secondary | ICD-10-CM | POA: Diagnosis not present

## 2016-12-09 DIAGNOSIS — Z0000 Encounter for general adult medical examination without abnormal findings: Secondary | ICD-10-CM | POA: Diagnosis not present

## 2016-12-10 DIAGNOSIS — R7309 Other abnormal glucose: Secondary | ICD-10-CM | POA: Diagnosis not present

## 2017-02-13 DIAGNOSIS — Z23 Encounter for immunization: Secondary | ICD-10-CM | POA: Diagnosis not present

## 2017-04-03 DIAGNOSIS — G252 Other specified forms of tremor: Secondary | ICD-10-CM | POA: Diagnosis not present

## 2017-04-03 DIAGNOSIS — Z79899 Other long term (current) drug therapy: Secondary | ICD-10-CM | POA: Diagnosis not present

## 2017-08-31 ENCOUNTER — Emergency Department (HOSPITAL_COMMUNITY)
Admission: EM | Admit: 2017-08-31 | Discharge: 2017-08-31 | Disposition: A | Discharge status: Home / Self Care | Payer: Medicare Other | Attending: Emergency Medicine | Admitting: Emergency Medicine

## 2017-08-31 ENCOUNTER — Encounter (HOSPITAL_COMMUNITY): Payer: Self-pay | Admitting: *Deleted

## 2017-08-31 DIAGNOSIS — N481 Balanitis: Secondary | ICD-10-CM | POA: Insufficient documentation

## 2017-08-31 DIAGNOSIS — Z79899 Other long term (current) drug therapy: Secondary | ICD-10-CM | POA: Diagnosis not present

## 2017-08-31 DIAGNOSIS — N489 Disorder of penis, unspecified: Secondary | ICD-10-CM | POA: Diagnosis present

## 2017-08-31 HISTORY — DX: Tremor, unspecified: R25.1

## 2017-08-31 HISTORY — DX: Dystonia, unspecified: G24.9

## 2017-08-31 LAB — URINALYSIS, ROUTINE W REFLEX MICROSCOPIC
Bilirubin Urine: NEGATIVE
Glucose, UA: NEGATIVE mg/dL
Hgb urine dipstick: NEGATIVE
KETONES UR: NEGATIVE mg/dL
LEUKOCYTES UA: NEGATIVE
NITRITE: NEGATIVE
Protein, ur: NEGATIVE mg/dL
SPECIFIC GRAVITY, URINE: 1.017 (ref 1.005–1.030)
pH: 5 (ref 5.0–8.0)

## 2017-08-31 MED ORDER — CEPHALEXIN 500 MG PO CAPS: 500.0000 mg | ORAL_CAPSULE | Freq: Four times a day (QID) | ORAL | 0 refills | Status: DC

## 2017-08-31 MED ORDER — CLOTRIMAZOLE 1 % EX CREA: TOPICAL_CREAM | CUTANEOUS | 0 refills | Status: DC

## 2017-08-31 NOTE — ED Provider Notes (Signed)
[facility]Y [facility] Provider Note   CSN: [id number] Arrival date & time: 08/31/17  1103     History   Chief Complaint Chief Complaint  Patient presents with  . Groin Swelling    HPI Aaron Coffey is a 42 y.o. male.  HPI Pt was seen at 1125. Per pt, c/o gradual onset and persistence of constant penis "redness" since yesterday. Pt states he noticed the tip of his penis start to become "red" and "swollen" yesterday. Denies penis pain, denies injury. Denies abd pain, no dysuria/hematuria, no testicular pain/swelling, no back pain, no fevers, no rash, no penile discharge.    Past Medical History:  Diagnosis Date  . Dystonia   . Tremor     Patient Active Problem List   Diagnosis Date Noted  . Intentional self-harm by knife (HCC)   . Adjustment disorder with mixed disturbance of emotions and conduct     Past Surgical History:  Procedure Laterality Date  . CHOLECYSTECTOMY          Home Medications    Prior to Admission medications   Medication Sig Start Date End Date Taking? Authorizing Provider  Omega-3 Fatty Acids (FISH OIL PO) Take 1 capsule by mouth daily.    [provider]  primidone (MYSOLINE) 50 MG tablet Take 100 mg by mouth 2 (two) times daily. 01/10/14   [provider]  simvastatin (ZOCOR) 20 MG tablet Take 20 mg by mouth every evening. 01/10/14   [provider]    Family History History reviewed. No pertinent family history.  Social History Social History   Tobacco Use  . Smoking status: Never Smoker  . Smokeless tobacco: Never Used  Substance Use Topics  . Alcohol use: Yes    Comment: occ  . Drug use: No     Allergies   Patient has no known allergies.   Review of Systems Review of Systems ROS: Statement: All systems negative except as marked or noted in the HPI; Constitutional: Negative for fever and chills. ; ; Eyes: Negative for eye pain, redness and discharge. ; ; ENMT: Negative for ear  pain, hoarseness, nasal congestion, sinus pressure and sore throat. ; ; Cardiovascular: Negative for chest pain, palpitations, diaphoresis, dyspnea and peripheral edema. ; ; Respiratory: Negative for cough, wheezing and stridor. ; ; Gastrointestinal: Negative for nausea, vomiting, diarrhea, abdominal pain, blood in stool, hematemesis, jaundice and rectal bleeding. . ; ; Genitourinary: Negative for dysuria, flank pain and hematuria. ; ; Musculoskeletal: Negative for back pain and neck pain. Negative for swelling and trauma.; ; Genital:  No penile drainage. +penile rash, no testicular pain or swelling, no scrotal rash or swelling. ;; Skin: Negative for pruritus, rash, abrasions, blisters, bruising and skin lesion.; ; Neuro: Negative for headache, lightheadedness and neck stiffness. Negative for weakness, altered level of consciousness, altered mental status, extremity weakness, paresthesias, involuntary movement, seizure and syncope.       Physical Exam Updated Vital Signs BP (!) 150/112 (BP Location: Right Arm)   Pulse 93   Temp 98.3 F (36.8 C) (Oral)   Resp 18   Ht 5\' 4"  (1.626 m)   Wt 104.3 kg (230 lb)   SpO2 98%   BMI 39.48 kg/m   Physical Exam 1130: Physical examination:  Nursing notes reviewed; Vital signs and O2 SAT reviewed;  Constitutional: Well developed, Well nourished, Well hydrated, In no acute distress; Head:  Normocephalic, atraumatic; Eyes: EOMI, PERRL, No scleral icterus; ENMT: Mouth and pharynx normal, Mucous membranes moist;  Neck: Supple, Full range of motion, No lymphadenopathy; Cardiovascular: Regular rate and rhythm, No gallop; Respiratory: Breath sounds clear & equal bilaterally, No wheezes.  Speaking full sentences with ease, Normal respiratory effort/excursion; Chest: Nontender, Movement normal; Abdomen: Soft, Nontender, Nondistended, Normal bowel sounds; Genitourinary: No CVA tenderness. Genital exam performed with pt permission and male ED RN chaperone present during  exam.  No perineal erythema.  No penile lesions or drainage. +erythema and mild edema to penis tip and glans area. Penile shaft NT, no erythema, no lesions. No scrotal erythema, edema or tenderness to palp.  Normal testicular lie.  No testicular tenderness to palp.  +cremasteric reflexes bilat.  No inguinal LAN or palpable masses..;;; Extremities: Peripheral pulses normal, No tenderness, No edema, No calf edema or asymmetry.; Neuro: AA&Ox3, Major CN grossly intact.  Speech clear. No gross focal motor or sensory deficits in extremities.; Skin: Color normal, Warm, Dry.    ED Treatments / Results  Labs (all labs ordered are listed, but only abnormal results are displayed)   EKG None  Radiology   Procedures Procedures (including critical care time)  Medications Ordered in ED Medications - No data to display   Initial Impression / Assessment and Plan / ED Course  I have reviewed the triage vital signs and the nursing notes.  Pertinent labs & imaging results that were available during my care of the patient were reviewed by me and considered in my medical decision making (see chart for details).  MDM Reviewed: previous chart, nursing note and vitals Interpretation: labs    Results for orders placed or performed during the hospital encounter of 08/31/17  Urinalysis, Routine w reflex microscopic  Result Value Ref Range   Color, Urine YELLOW YELLOW   APPearance CLEAR CLEAR   Specific Gravity, Urine 1.017 1.005 - 1.030   pH 5.0 5.0 - 8.0   Glucose, UA NEGATIVE NEGATIVE mg/dL   Hgb urine dipstick NEGATIVE NEGATIVE   Bilirubin Urine NEGATIVE NEGATIVE   Ketones, ur NEGATIVE NEGATIVE mg/dL   Protein, ur NEGATIVE NEGATIVE mg/dL   Nitrite NEGATIVE NEGATIVE   Leukocytes, UA NEGATIVE NEGATIVE    1205:  Will tx for balanitis. Dx and testing d/w pt and family.  Questions answered.  Verb understanding, agreeable to d/c home with outpt f/u.    Final Clinical Impressions(s) / ED  Diagnoses   Final diagnoses:  None    ED Discharge Orders    None       Samuel Jester, DO 09/04/17 9811

## 2017-08-31 NOTE — ED Triage Notes (Signed)
Pt lifted a heavy box yesterday, tip of penis red and swollen. Noted blood of tip but when he urinated no blood noted in urine. Denies discharge or burning with urination.

## 2017-08-31 NOTE — Discharge Instructions (Signed)
Take the prescriptions as directed. Keep the area clean and dry.  Call your regular medical doctor on Monday to schedule a follow up appointment within the next 3 days.  Return to the Emergency Department immediately sooner if worsening.

## 2017-09-02 LAB — URINE CULTURE

## 2017-12-08 DIAGNOSIS — E119 Type 2 diabetes mellitus without complications: Secondary | ICD-10-CM | POA: Diagnosis not present

## 2017-12-08 DIAGNOSIS — E785 Hyperlipidemia, unspecified: Secondary | ICD-10-CM | POA: Diagnosis not present

## 2017-12-08 DIAGNOSIS — R7309 Other abnormal glucose: Secondary | ICD-10-CM | POA: Diagnosis not present

## 2017-12-08 DIAGNOSIS — Z79899 Other long term (current) drug therapy: Secondary | ICD-10-CM | POA: Diagnosis not present

## 2017-12-18 ENCOUNTER — Inpatient Hospital Stay (HOSPITAL_COMMUNITY)
Admission: EM | Admit: 2017-12-18 | Discharge: 2017-12-19 | DRG: 343 | Disposition: A | Discharge status: Home / Self Care | Payer: Medicare Other | Attending: General Surgery | Admitting: General Surgery

## 2017-12-18 ENCOUNTER — Emergency Department (HOSPITAL_COMMUNITY): Payer: Medicare Other

## 2017-12-18 ENCOUNTER — Encounter (HOSPITAL_COMMUNITY): Payer: Self-pay | Admitting: Emergency Medicine

## 2017-12-18 ENCOUNTER — Other Ambulatory Visit: Payer: Self-pay

## 2017-12-18 DIAGNOSIS — Z79899 Other long term (current) drug therapy: Secondary | ICD-10-CM

## 2017-12-18 DIAGNOSIS — F4325 Adjustment disorder with mixed disturbance of emotions and conduct: Secondary | ICD-10-CM | POA: Diagnosis present

## 2017-12-18 DIAGNOSIS — K37 Unspecified appendicitis: Secondary | ICD-10-CM | POA: Diagnosis present

## 2017-12-18 DIAGNOSIS — G249 Dystonia, unspecified: Secondary | ICD-10-CM | POA: Diagnosis present

## 2017-12-18 DIAGNOSIS — E876 Hypokalemia: Secondary | ICD-10-CM | POA: Diagnosis not present

## 2017-12-18 DIAGNOSIS — F79 Unspecified intellectual disabilities: Secondary | ICD-10-CM | POA: Diagnosis not present

## 2017-12-18 DIAGNOSIS — Z9049 Acquired absence of other specified parts of digestive tract: Secondary | ICD-10-CM

## 2017-12-18 DIAGNOSIS — K3580 Unspecified acute appendicitis: Secondary | ICD-10-CM | POA: Diagnosis not present

## 2017-12-18 DIAGNOSIS — B9561 Methicillin susceptible Staphylococcus aureus infection as the cause of diseases classified elsewhere: Secondary | ICD-10-CM | POA: Diagnosis not present

## 2017-12-18 DIAGNOSIS — R109 Unspecified abdominal pain: Secondary | ICD-10-CM | POA: Diagnosis not present

## 2017-12-18 DIAGNOSIS — Z792 Long term (current) use of antibiotics: Secondary | ICD-10-CM | POA: Diagnosis not present

## 2017-12-18 LAB — COMPREHENSIVE METABOLIC PANEL
ALK PHOS: 79 U/L (ref 38–126)
ALT: 32 U/L (ref 0–44)
ANION GAP: 9 (ref 5–15)
AST: 30 U/L (ref 15–41)
Albumin: 4.2 g/dL (ref 3.5–5.0)
BUN: 11 mg/dL (ref 6–20)
CALCIUM: 9.1 mg/dL (ref 8.9–10.3)
CO2: 25 mmol/L (ref 22–32)
CREATININE: 0.92 mg/dL (ref 0.61–1.24)
Chloride: 103 mmol/L (ref 98–111)
Glucose, Bld: 277 mg/dL — ABNORMAL HIGH (ref 70–99)
Potassium: 3.2 mmol/L — ABNORMAL LOW (ref 3.5–5.1)
Sodium: 137 mmol/L (ref 135–145)
Total Bilirubin: 0.8 mg/dL (ref 0.3–1.2)
Total Protein: 7.7 g/dL (ref 6.5–8.1)

## 2017-12-18 LAB — URINALYSIS, ROUTINE W REFLEX MICROSCOPIC
BACTERIA UA: NONE SEEN
Bilirubin Urine: NEGATIVE
Glucose, UA: >=500 mg/dL — AB
HGB URINE DIPSTICK: NEGATIVE
Ketones, ur: 5 mg/dL — AB
NITRITE: NEGATIVE
PH: 5 (ref 5.0–8.0)
Protein, ur: 30 mg/dL — AB
SPECIFIC GRAVITY, URINE: 1.026 (ref 1.005–1.030)

## 2017-12-18 LAB — CBC
HCT: 41.7 % (ref 39.0–52.0)
HEMOGLOBIN: 14.7 g/dL (ref 13.0–17.0)
MCH: 28.5 pg (ref 26.0–34.0)
MCHC: 35.3 g/dL (ref 30.0–36.0)
MCV: 81 fL (ref 78.0–100.0)
Platelets: 308 10*3/uL (ref 150–400)
RBC: 5.15 MIL/uL (ref 4.22–5.81)
RDW: 12.9 % (ref 11.5–15.5)
WBC: 5.9 10*3/uL (ref 4.0–10.5)

## 2017-12-18 LAB — LIPASE, BLOOD: Lipase: 24 U/L (ref 11–51)

## 2017-12-18 MED ORDER — IOHEXOL 300 MG/ML  SOLN
100.0000 mL | Freq: Once | INTRAMUSCULAR | Status: AC | PRN
  Administered 2017-12-18: 100 mL via INTRAVENOUS

## 2017-12-18 MED ORDER — SODIUM CHLORIDE 0.9 % IV SOLN
2.0000 g | Freq: Once | INTRAVENOUS | Status: AC
  Administered 2017-12-18: 2 g via INTRAVENOUS
  Filled 2017-12-18: qty 20

## 2017-12-18 MED ORDER — SODIUM CHLORIDE 0.9 % IV BOLUS
1000.0000 mL | Freq: Once | INTRAVENOUS | Status: AC
  Administered 2017-12-18: 1000 mL via INTRAVENOUS

## 2017-12-18 MED ORDER — LORAZEPAM 2 MG/ML IJ SOLN
0.5000 mg | Freq: Once | INTRAMUSCULAR | Status: AC
  Administered 2017-12-18: 0.5 mg via INTRAVENOUS
  Filled 2017-12-18: qty 1

## 2017-12-18 MED ORDER — METRONIDAZOLE IN NACL 5-0.79 MG/ML-% IV SOLN
500.0000 mg | Freq: Once | INTRAVENOUS | Status: AC
  Administered 2017-12-19: 500 mg via INTRAVENOUS
  Filled 2017-12-18: qty 100

## 2017-12-18 MED ORDER — SODIUM CHLORIDE 0.9 % IV SOLN
INTRAVENOUS | Status: DC | PRN
  Administered 2017-12-19: 500 mL via INTRAVENOUS

## 2017-12-18 MED ORDER — POTASSIUM CHLORIDE CRYS ER 20 MEQ PO TBCR
40.0000 meq | EXTENDED_RELEASE_TABLET | Freq: Once | ORAL | Status: AC
  Administered 2017-12-18: 40 meq via ORAL
  Filled 2017-12-18: qty 2

## 2017-12-18 NOTE — ED Provider Notes (Signed)
[facility] Provider Note   CSN: [id number] Arrival date & time: 12/18/17  1936     History   Chief Complaint Chief Complaint  Patient presents with  . Abdominal Pain    HPI Aaron Coffey is a 42 y.o. male with past medical history of dystonia, tremor, cholecystectomy who presents today for complaints of left lower quadrant abdominal pain.  This started 2 to 3 days ago.  He currently rates his pain a 2 or 3 out of 10.  He denies nausea vomiting diarrhea or constipation, no fevers at home, no urinary symptoms.  His pain is made worse by laying on his stomach.  Reports that his appetite has been okay.  HPI  Past Medical History:  Diagnosis Date  . Dystonia   . Tremor     Patient Active Problem List   Diagnosis Date Noted  . Intentional self-harm by knife (HCC)   . Adjustment disorder with mixed disturbance of emotions and conduct     Past Surgical History:  Procedure Laterality Date  . CHOLECYSTECTOMY          Home Medications    Prior to Admission medications   Medication Sig Start Date End Date Taking? Authorizing Provider  cephALEXin (KEFLEX) 500 MG capsule Take 1 capsule (500 mg total) by mouth 4 (four) times daily. 08/31/17   Samuel JesterMcManus, Kathleen, DO  clotrimazole (LOTRIMIN) 1 % cream Apply to affected area 2 times daily for the next 7 days 08/31/17   Samuel JesterMcManus, Kathleen, DO  Omega-3 Fatty Acids (FISH OIL PO) Take 1 capsule by mouth daily.    [provider]  primidone (MYSOLINE) 50 MG tablet Take 100 mg by mouth 2 (two) times daily. 01/10/14   [provider]  simvastatin (ZOCOR) 20 MG tablet Take 20 mg by mouth every evening. 01/10/14   [provider]    Family History No family history on file.  Social History Social History   Tobacco Use  . Smoking status: Never Smoker  . Smokeless tobacco: Never Used  Substance Use Topics  . Alcohol use: Yes    Comment: occ  . Drug use: No     Allergies   Patient  has no known allergies.   Review of Systems Review of Systems  Constitutional: Negative for chills and fever.  Respiratory: Negative for shortness of breath.   Cardiovascular: Negative for chest pain.  Gastrointestinal: Positive for abdominal pain. Negative for anal bleeding, blood in stool, constipation, diarrhea, nausea and vomiting.  Genitourinary: Negative for decreased urine volume.  Musculoskeletal: Negative for back pain.  All other systems reviewed and are negative.    Physical Exam Updated Vital Signs BP (!) 148/101 (BP Location: Right Arm)   Pulse (!) 101   Temp 99.1 F (37.3 C) (Oral)   Resp 18   Ht 5\' 6"  (1.676 m)   Wt 99.3 kg   SpO2 98%   BMI 35.35 kg/m   Physical Exam  Constitutional: He is oriented to person, place, and time. He appears well-developed and well-nourished.  HENT:  Head: Normocephalic and atraumatic.  Eyes: Conjunctivae are normal.  Neck: Neck supple.  Cardiovascular: Normal rate, regular rhythm and normal heart sounds.  No murmur heard. Pulmonary/Chest: Effort normal and breath sounds normal. No respiratory distress.  Abdominal: Soft. Normal appearance and bowel sounds are normal. There is tenderness (There is no obvious left lower quadrant tenderness on my exam, however with palpation of right lower quadrant he attempts to pull back, push  my hand away, and says that it hurts.) in the right lower quadrant. There is guarding. There is no rigidity and no rebound.  Musculoskeletal: He exhibits no edema.  Neurological: He is alert and oriented to person, place, and time.  Skin: Skin is warm and dry.  Psychiatric: He has a normal mood and affect.  Nursing note and vitals reviewed.    ED Treatments / Results  Labs (all labs ordered are listed, but only abnormal results are displayed) Labs Reviewed  COMPREHENSIVE METABOLIC PANEL - Abnormal; Notable for the following components:      Result Value   Potassium 3.2 (*)    Glucose, Bld 277 (*)      All other components within normal limits  URINALYSIS, ROUTINE W REFLEX MICROSCOPIC - Abnormal; Notable for the following components:   Color, Urine AMBER (*)    APPearance CLOUDY (*)    Glucose, UA >=500 (*)    Ketones, ur 5 (*)    Protein, ur 30 (*)    Leukocytes, UA TRACE (*)    All other components within normal limits  LIPASE, BLOOD  CBC    EKG None  Radiology Ct Abdomen Pelvis W Contrast  Result Date: 12/18/2017 CLINICAL DATA:  Abdominal pain and fever. EXAM: CT ABDOMEN AND PELVIS WITH CONTRAST TECHNIQUE: Multidetector CT imaging of the abdomen and pelvis was performed using the standard protocol following bolus administration of intravenous contrast. CONTRAST:  OMNIPAQUE IOHEXOL 300 MG/ML  SOLN COMPARISON:  None. FINDINGS: LOWER CHEST: There is no basilar pleural or apical pericardial effusion. HEPATOBILIARY: Diffuse hypoattenuation of the liver relative to the spleen suggests hepatic steatosis. No focal liver lesion or biliary dilatation. Status post cholecystectomy. PANCREAS: The pancreatic parenchymal contours are normal and there is no ductal dilatation. There is no peripancreatic fluid collection. SPLEEN: Normal. ADRENALS/URINARY TRACT: --Adrenal glands: Normal. --Right kidney/ureter: No hydronephrosis, nephroureterolithiasis, perinephric stranding or solid renal mass. 3 cm right renal cyst. --Left kidney/ureter: No hydronephrosis, nephroureterolithiasis, perinephric stranding or solid renal mass. --Urinary bladder: Normal for degree of distention STOMACH/BOWEL: --Stomach/Duodenum: There is no hiatal hernia or other gastric abnormality. The duodenal course and caliber are normal. --Small bowel: No dilatation or inflammation. --Colon: No focal abnormality. Appendix: Location: 6 o'clock position relative to the cecum. Diameter: 8 mm Appendicolith: None Mucosal hyper-enhancement: None Extraluminal gas: None Periappendiceal collection: Periappendiceal stranding without fluid  collection. VASCULAR/LYMPHATIC: Normal course and caliber of the major abdominal vessels. No abdominal or pelvic lymphadenopathy. REPRODUCTIVE: No free fluid in the pelvis. MUSCULOSKELETAL. No bony spinal canal stenosis or focal osseous abnormality. OTHER: None. IMPRESSION: Mildly dilated appendix with adjacent stranding suggestive of early acute appendicitis. No free intraperitoneal air or fluid collection. Electronically Signed   By: Deatra Robinson M.D.   On: 12/18/2017 22:43    Procedures Procedures (including critical care time)  Medications Ordered in ED Medications  cefTRIAXone (ROCEPHIN) 2 g in sodium chloride 0.9 % 100 mL IVPB (has no administration in time range)    And  metroNIDAZOLE (FLAGYL) IVPB 500 mg (has no administration in time range)  sodium chloride 0.9 % bolus 1,000 mL (0 mLs Intravenous Stopped 12/18/17 2133)  potassium chloride SA (K-DUR,KLOR-CON) CR tablet 40 mEq (40 mEq Oral Given 12/18/17 2130)  iohexol (OMNIPAQUE) 300 MG/ML solution 100 mL (100 mLs Intravenous Contrast Given 12/18/17 2214)  LORazepam (ATIVAN) injection 0.5 mg (0.5 mg Intravenous Given 12/18/17 2319)     Initial Impression / Assessment and Plan / ED Course  I have reviewed the triage vital  signs and the nursing notes.  Pertinent labs & imaging results that were available during my care of the patient were reviewed by me and considered in my medical decision making (see chart for details).  Clinical Course as of Dec 19 2327  Thu Dec 18, 2017  2312 Spoke with Dr. Lovell Sheehan of general surgery who states start antibiotics, NPO at midnight.  Surgery in the morning.  Medicine to admit.  Discussed results with patient and mother, patient is anxious, requests something to help him calm down.  Will give Ativan.  He still states he does not need pain or nausea medicine.    [EH]    Clinical Course User Index [EH] Cristina Gong, PA-C   Presents today for evaluation which he states his left lower quadrant  abdominal pain for the past few days, on exam he had tenderness over the right lower quadrant.  Labs were obtained, he does not have a significant leukocytosis.  He is mildly hypokalemic with a potassium of 3.2, glucose is significantly elevated at 277.  Urine has over 500 glucose with trace leukocytes, 30 protein, and no bacteria seen, suspect contamination.  He used to be on medicines for diabetes, however he was taken off of these as his sugars had improved.  This result was discussed with patient and his mother.  CT scan showing appendicitis without obvious complication.  Patient is started on Rocephin and Flagyl.  He refused pain and nausea medicine, however requested something to help him calm down, therefore was given Ativan.  His potassium was orally repleted with K-Dur.  I spoke with Dr. Lovell Sheehan from general surgery who requested hospitalist admit and states he will take out the appendix in the morning.  This was discussed with patient and his mother, mother expressed concerns about Dr. Lovell Sheehan being the surgeon to take out his appendix, I informed her that he is the surgeon on call tonight, and if that this would better be addressed in the morning, and they stated their understanding.    Patient was seen as a shared visit with Dr. Clayborne Dana.    I spoke with the hospitalist who will admit the patient.  Final Clinical Impressions(s) / ED Diagnoses   Final diagnoses:  Acute appendicitis, unspecified acute appendicitis type    ED Discharge Orders    None       Aaron Coffey 12/18/17 2330    Mesner, Barbara Cower, MD 12/19/17 [phone]

## 2017-12-18 NOTE — ED Notes (Signed)
Patient back from CT scan.

## 2017-12-18 NOTE — ED Triage Notes (Signed)
Pt C/O LLQ abdominal pain that began 2 days ago. Pt denies N/V/D. Pt denies urinary symptoms and fever. Pt states the pain is worse when lying on stomach.

## 2017-12-18 NOTE — ED Provider Notes (Signed)
Medical screening examination/treatment/procedure(s) were conducted as a shared visit with non-physician practitioner(s) and myself.  I personally evaluated the patient during the encounter.  Few days of nausea vomiting right lower quadrant abdominal pain along with a bit of anorexia.  Exam with normal vital signs but right lower quadrant tenderness.  No rebound or guarding.  No Rovsing sign.  Plan for CT to evaluate for appendicitis, disposition as appropriate.   Marily MemosMesner, Shronda Boeh, MD 12/19/17 [phone]

## 2017-12-18 NOTE — ED Notes (Signed)
Patient transported to CT 

## 2017-12-19 ENCOUNTER — Inpatient Hospital Stay (HOSPITAL_COMMUNITY): Payer: Medicare Other | Admitting: Anesthesiology

## 2017-12-19 ENCOUNTER — Encounter (HOSPITAL_COMMUNITY): Payer: Self-pay | Admitting: Anesthesiology

## 2017-12-19 ENCOUNTER — Encounter (HOSPITAL_COMMUNITY)
Admission: EM | Disposition: A | Discharge status: Home / Self Care | Payer: Self-pay | Source: Home / Self Care | Attending: General Surgery

## 2017-12-19 DIAGNOSIS — B9561 Methicillin susceptible Staphylococcus aureus infection as the cause of diseases classified elsewhere: Secondary | ICD-10-CM | POA: Diagnosis not present

## 2017-12-19 DIAGNOSIS — F79 Unspecified intellectual disabilities: Secondary | ICD-10-CM | POA: Diagnosis not present

## 2017-12-19 DIAGNOSIS — E876 Hypokalemia: Secondary | ICD-10-CM | POA: Diagnosis not present

## 2017-12-19 DIAGNOSIS — K3580 Unspecified acute appendicitis: Secondary | ICD-10-CM | POA: Diagnosis not present

## 2017-12-19 DIAGNOSIS — Z9049 Acquired absence of other specified parts of digestive tract: Secondary | ICD-10-CM | POA: Diagnosis not present

## 2017-12-19 DIAGNOSIS — F4325 Adjustment disorder with mixed disturbance of emotions and conduct: Secondary | ICD-10-CM | POA: Diagnosis not present

## 2017-12-19 DIAGNOSIS — K37 Unspecified appendicitis: Secondary | ICD-10-CM | POA: Diagnosis present

## 2017-12-19 DIAGNOSIS — G249 Dystonia, unspecified: Secondary | ICD-10-CM | POA: Diagnosis not present

## 2017-12-19 DIAGNOSIS — Z792 Long term (current) use of antibiotics: Secondary | ICD-10-CM | POA: Diagnosis not present

## 2017-12-19 DIAGNOSIS — Z79899 Other long term (current) drug therapy: Secondary | ICD-10-CM | POA: Diagnosis not present

## 2017-12-19 HISTORY — PX: LAPAROSCOPIC APPENDECTOMY: SHX408

## 2017-12-19 LAB — CBC
HCT: 38.1 % — ABNORMAL LOW (ref 39.0–52.0)
Hemoglobin: 13.1 g/dL (ref 13.0–17.0)
MCH: 28.4 pg (ref 26.0–34.0)
MCHC: 34.4 g/dL (ref 30.0–36.0)
MCV: 82.5 fL (ref 78.0–100.0)
Platelets: 266 10*3/uL (ref 150–400)
RBC: 4.62 MIL/uL (ref 4.22–5.81)
RDW: 12.7 % (ref 11.5–15.5)
WBC: 5.6 10*3/uL (ref 4.0–10.5)

## 2017-12-19 LAB — COMPREHENSIVE METABOLIC PANEL
ALBUMIN: 3.5 g/dL (ref 3.5–5.0)
ALT: 28 U/L (ref 0–44)
AST: 24 U/L (ref 15–41)
Alkaline Phosphatase: 64 U/L (ref 38–126)
Anion gap: 8 (ref 5–15)
BUN: 9 mg/dL (ref 6–20)
CO2: 25 mmol/L (ref 22–32)
Calcium: 8.5 mg/dL — ABNORMAL LOW (ref 8.9–10.3)
Chloride: 106 mmol/L (ref 98–111)
Creatinine, Ser: 0.67 mg/dL (ref 0.61–1.24)
GFR calc Af Amer: >60 mL/min (ref >60)
GLUCOSE: 166 mg/dL — AB (ref 70–99)
Potassium: 3.3 mmol/L — ABNORMAL LOW (ref 3.5–5.1)
SODIUM: 139 mmol/L (ref 135–145)
Total Bilirubin: 0.6 mg/dL (ref 0.3–1.2)
Total Protein: 6.8 g/dL (ref 6.5–8.1)

## 2017-12-19 LAB — GLUCOSE, CAPILLARY
GLUCOSE-CAPILLARY: 167 mg/dL — AB (ref 70–99)
Glucose-Capillary: 179 mg/dL — ABNORMAL HIGH (ref 70–99)
Glucose-Capillary: 158 mg/dL — ABNORMAL HIGH (ref 70–99)

## 2017-12-19 LAB — SURGICAL PCR SCREEN
MRSA, PCR: NEGATIVE
Staphylococcus aureus: POSITIVE — AB

## 2017-12-19 LAB — HEMOGLOBIN A1C
Hgb A1c MFr Bld: 8 % — ABNORMAL HIGH (ref 4.8–5.6)
MEAN PLASMA GLUCOSE: 182.9 mg/dL

## 2017-12-19 SURGERY — APPENDECTOMY, LAPAROSCOPIC
Anesthesia: General

## 2017-12-19 MED ORDER — KETOROLAC TROMETHAMINE 30 MG/ML IJ SOLN
30.0000 mg | Freq: Four times a day (QID) | INTRAMUSCULAR | Status: AC
  Administered 2017-12-19: 30 mg via INTRAVENOUS

## 2017-12-19 MED ORDER — SIMETHICONE 80 MG PO CHEW: 40.0000 mg | CHEWABLE_TABLET | Freq: Four times a day (QID) | ORAL | Status: DC | PRN

## 2017-12-19 MED ORDER — EPHEDRINE SULFATE 50 MG/ML IJ SOLN
INTRAMUSCULAR | Status: DC | PRN
  Administered 2017-12-19 (×2): 5 mg via INTRAVENOUS

## 2017-12-19 MED ORDER — HYDROMORPHONE HCL 1 MG/ML IJ SOLN
0.2500 mg | INTRAMUSCULAR | Status: DC | PRN
  Administered 2017-12-19 (×2): 0.5 mg via INTRAVENOUS
  Filled 2017-12-19 (×2): qty 0.5

## 2017-12-19 MED ORDER — PROPOFOL 10 MG/ML IV BOLUS
INTRAVENOUS | Status: AC
  Filled 2017-12-19: qty 20

## 2017-12-19 MED ORDER — HYDROCODONE-ACETAMINOPHEN 5-325 MG PO TABS
1.0000 | ORAL_TABLET | ORAL | Status: DC | PRN
  Administered 2017-12-19: 1 via ORAL
  Filled 2017-12-19: qty 1

## 2017-12-19 MED ORDER — ENOXAPARIN SODIUM 40 MG/0.4ML ~~LOC~~ SOLN
40.0000 mg | SUBCUTANEOUS | Status: DC
  Administered 2017-12-19: 40 mg via SUBCUTANEOUS
  Filled 2017-12-19: qty 0.4

## 2017-12-19 MED ORDER — HYDROCODONE-ACETAMINOPHEN 5-325 MG PO TABS: 1.0000 | ORAL_TABLET | ORAL | 0 refills | Status: DC | PRN

## 2017-12-19 MED ORDER — PHENYLEPHRINE HCL 10 MG/ML IJ SOLN
INTRAMUSCULAR | Status: AC
  Filled 2017-12-19: qty 1

## 2017-12-19 MED ORDER — PHENYLEPHRINE 40 MCG/ML (10ML) SYRINGE FOR IV PUSH (FOR BLOOD PRESSURE SUPPORT)
PREFILLED_SYRINGE | INTRAVENOUS | Status: AC
  Filled 2017-12-19: qty 10

## 2017-12-19 MED ORDER — INSULIN ASPART 100 UNIT/ML ~~LOC~~ SOLN
0.0000 [IU] | Freq: Four times a day (QID) | SUBCUTANEOUS | Status: DC
  Administered 2017-12-19: 2 [IU] via SUBCUTANEOUS

## 2017-12-19 MED ORDER — SODIUM CHLORIDE 0.9 % IV SOLN
INTRAVENOUS | Status: DC
  Administered 2017-12-19: 04:00:00 via INTRAVENOUS

## 2017-12-19 MED ORDER — ONDANSETRON HCL 4 MG/2ML IJ SOLN: 4.0000 mg | Freq: Four times a day (QID) | INTRAMUSCULAR | Status: DC | PRN

## 2017-12-19 MED ORDER — FENTANYL CITRATE (PF) 100 MCG/2ML IJ SOLN
INTRAMUSCULAR | Status: DC | PRN
  Administered 2017-12-19: 100 ug via INTRAVENOUS
  Administered 2017-12-19 (×3): 50 ug via INTRAVENOUS

## 2017-12-19 MED ORDER — HYDRALAZINE HCL 20 MG/ML IJ SOLN
INTRAMUSCULAR | Status: DC | PRN
  Administered 2017-12-19 (×2): 4 mg via INTRAVENOUS

## 2017-12-19 MED ORDER — HYDRALAZINE HCL 20 MG/ML IJ SOLN
INTRAMUSCULAR | Status: AC
  Filled 2017-12-19: qty 1

## 2017-12-19 MED ORDER — SUGAMMADEX SODIUM 500 MG/5ML IV SOLN
INTRAVENOUS | Status: AC
  Filled 2017-12-19: qty 5

## 2017-12-19 MED ORDER — MUPIROCIN 2 % EX OINT
1.0000 "application " | TOPICAL_OINTMENT | Freq: Two times a day (BID) | CUTANEOUS | Status: DC
  Filled 2017-12-19: qty 22

## 2017-12-19 MED ORDER — BUPIVACAINE LIPOSOME 1.3 % IJ SUSP
INTRAMUSCULAR | Status: DC | PRN
  Administered 2017-12-19: 20 mL

## 2017-12-19 MED ORDER — DIPHENHYDRAMINE HCL 50 MG/ML IJ SOLN: 25.0000 mg | Freq: Four times a day (QID) | INTRAMUSCULAR | Status: DC | PRN

## 2017-12-19 MED ORDER — LACTATED RINGERS IV SOLN: INTRAVENOUS | Status: DC

## 2017-12-19 MED ORDER — ROCURONIUM BROMIDE 50 MG/5ML IV SOLN
INTRAVENOUS | Status: AC
  Filled 2017-12-19: qty 1

## 2017-12-19 MED ORDER — HYDROCODONE-ACETAMINOPHEN 7.5-325 MG PO TABS: 1.0000 | ORAL_TABLET | Freq: Once | ORAL | Status: DC | PRN

## 2017-12-19 MED ORDER — GLYCOPYRROLATE 0.2 MG/ML IJ SOLN
INTRAMUSCULAR | Status: AC
  Filled 2017-12-19: qty 1

## 2017-12-19 MED ORDER — HYDROMORPHONE HCL 1 MG/ML IJ SOLN: 1.0000 mg | INTRAMUSCULAR | Status: DC | PRN

## 2017-12-19 MED ORDER — CIPROFLOXACIN IN D5W 400 MG/200ML IV SOLN
400.0000 mg | Freq: Two times a day (BID) | INTRAVENOUS | Status: DC
  Administered 2017-12-19: 400 mg via INTRAVENOUS
  Filled 2017-12-19: qty 200

## 2017-12-19 MED ORDER — ROCURONIUM BROMIDE 100 MG/10ML IV SOLN
INTRAVENOUS | Status: DC | PRN
  Administered 2017-12-19: 25 mg via INTRAVENOUS
  Administered 2017-12-19: 5 mg via INTRAVENOUS
  Administered 2017-12-19: 10 mg via INTRAVENOUS

## 2017-12-19 MED ORDER — MIDAZOLAM HCL 2 MG/2ML IJ SOLN
INTRAMUSCULAR | Status: DC | PRN
  Administered 2017-12-19: 2 mg via INTRAVENOUS

## 2017-12-19 MED ORDER — INSULIN ASPART 100 UNIT/ML ~~LOC~~ SOLN: 0.0000 [IU] | Freq: Four times a day (QID) | SUBCUTANEOUS | Status: DC

## 2017-12-19 MED ORDER — PROPOFOL 10 MG/ML IV BOLUS
INTRAVENOUS | Status: AC
  Filled 2017-12-19: qty 40

## 2017-12-19 MED ORDER — ACETAMINOPHEN 325 MG PO TABS: 650.0000 mg | ORAL_TABLET | Freq: Four times a day (QID) | ORAL | Status: DC | PRN

## 2017-12-19 MED ORDER — KETOROLAC TROMETHAMINE 30 MG/ML IJ SOLN: 30.0000 mg | Freq: Four times a day (QID) | INTRAMUSCULAR | Status: DC | PRN

## 2017-12-19 MED ORDER — ONDANSETRON HCL 4 MG/2ML IJ SOLN: 4.0000 mg | Freq: Once | INTRAMUSCULAR | Status: DC | PRN

## 2017-12-19 MED ORDER — DIPHENHYDRAMINE HCL 25 MG PO CAPS: 25.0000 mg | ORAL_CAPSULE | Freq: Four times a day (QID) | ORAL | Status: DC | PRN

## 2017-12-19 MED ORDER — SUCCINYLCHOLINE CHLORIDE 20 MG/ML IJ SOLN
INTRAMUSCULAR | Status: AC
  Filled 2017-12-19: qty 1

## 2017-12-19 MED ORDER — POVIDONE-IODINE 10 % OINT PACKET
TOPICAL_OINTMENT | CUTANEOUS | Status: DC | PRN
  Administered 2017-12-19: 1 via TOPICAL

## 2017-12-19 MED ORDER — POVIDONE-IODINE 10 % EX OINT
TOPICAL_OINTMENT | CUTANEOUS | Status: AC
  Filled 2017-12-19: qty 1

## 2017-12-19 MED ORDER — ACETAMINOPHEN 650 MG RE SUPP: 650.0000 mg | Freq: Four times a day (QID) | RECTAL | Status: DC | PRN

## 2017-12-19 MED ORDER — PROPOFOL 10 MG/ML IV BOLUS
INTRAVENOUS | Status: DC | PRN
  Administered 2017-12-19: 30 mg via INTRAVENOUS
  Administered 2017-12-19: 200 mg via INTRAVENOUS

## 2017-12-19 MED ORDER — LIDOCAINE HCL (PF) 1 % IJ SOLN
INTRAMUSCULAR | Status: AC
  Filled 2017-12-19: qty 5

## 2017-12-19 MED ORDER — FENTANYL CITRATE (PF) 250 MCG/5ML IJ SOLN
INTRAMUSCULAR | Status: AC
  Filled 2017-12-19: qty 5

## 2017-12-19 MED ORDER — POTASSIUM CHLORIDE 10 MEQ/100ML IV SOLN
10.0000 meq | Freq: Once | INTRAVENOUS | Status: AC
Start: 2017-12-19 — End: 2017-12-19
  Administered 2017-12-19: 10 meq via INTRAVENOUS
  Filled 2017-12-19: qty 100

## 2017-12-19 MED ORDER — GLYCOPYRROLATE 0.2 MG/ML IJ SOLN
INTRAMUSCULAR | Status: DC | PRN
  Administered 2017-12-19: 0.2 mg via INTRAVENOUS

## 2017-12-19 MED ORDER — CHLORHEXIDINE GLUCONATE CLOTH 2 % EX PADS
6.0000 | MEDICATED_PAD | Freq: Every day | CUTANEOUS | Status: DC
  Administered 2017-12-19: 6 via TOPICAL

## 2017-12-19 MED ORDER — BUPIVACAINE LIPOSOME 1.3 % IJ SUSP
INTRAMUSCULAR | Status: AC
  Filled 2017-12-19: qty 20

## 2017-12-19 MED ORDER — CHLORHEXIDINE GLUCONATE CLOTH 2 % EX PADS: 6.0000 | MEDICATED_PAD | Freq: Once | CUTANEOUS | Status: DC

## 2017-12-19 MED ORDER — MEPERIDINE HCL 50 MG/ML IJ SOLN: 6.2500 mg | INTRAMUSCULAR | Status: DC | PRN

## 2017-12-19 MED ORDER — SUGAMMADEX SODIUM 500 MG/5ML IV SOLN
INTRAVENOUS | Status: DC | PRN
  Administered 2017-12-19: 300 mg via INTRAVENOUS

## 2017-12-19 MED ORDER — CIPROFLOXACIN IN D5W 400 MG/200ML IV SOLN
400.0000 mg | Freq: Once | INTRAVENOUS | Status: AC
  Administered 2017-12-19: 400 mg via INTRAVENOUS
  Filled 2017-12-19: qty 200

## 2017-12-19 MED ORDER — SUCCINYLCHOLINE CHLORIDE 20 MG/ML IJ SOLN
INTRAMUSCULAR | Status: DC | PRN
  Administered 2017-12-19: 140 mg via INTRAVENOUS

## 2017-12-19 MED ORDER — MIDAZOLAM HCL 2 MG/2ML IJ SOLN
INTRAMUSCULAR | Status: AC
  Filled 2017-12-19: qty 2

## 2017-12-19 MED ORDER — ONDANSETRON HCL 4 MG/2ML IJ SOLN
INTRAMUSCULAR | Status: DC | PRN
  Administered 2017-12-19: 4 mg via INTRAVENOUS

## 2017-12-19 MED ORDER — LACTATED RINGERS IV SOLN
INTRAVENOUS | Status: DC | PRN
  Administered 2017-12-19 (×2): via INTRAVENOUS

## 2017-12-19 MED ORDER — KETOROLAC TROMETHAMINE 30 MG/ML IJ SOLN
30.0000 mg | Freq: Once | INTRAMUSCULAR | Status: DC | PRN
  Filled 2017-12-19: qty 1

## 2017-12-19 MED ORDER — METRONIDAZOLE IN NACL 5-0.79 MG/ML-% IV SOLN
500.0000 mg | Freq: Three times a day (TID) | INTRAVENOUS | Status: DC
  Administered 2017-12-19: 500 mg via INTRAVENOUS
  Filled 2017-12-19: qty 100

## 2017-12-19 MED ORDER — SODIUM CHLORIDE 0.9 % IR SOLN
Status: DC | PRN
  Administered 2017-12-19: 1000 mL

## 2017-12-19 MED ORDER — ONDANSETRON 4 MG PO TBDP: 4.0000 mg | ORAL_TABLET | Freq: Four times a day (QID) | ORAL | Status: DC | PRN

## 2017-12-19 MED ORDER — MUPIROCIN 2 % EX OINT
1.0000 "application " | TOPICAL_OINTMENT | Freq: Two times a day (BID) | CUTANEOUS | Status: DC
  Administered 2017-12-19: 1 via NASAL

## 2017-12-19 SURGICAL SUPPLY — 58 items
BAG RETRIEVAL 10 (BASKET) ×1
BAG RETRIEVAL 10MM (BASKET) ×1
CATH FOLEY 2WAY SLVR  5CC 12FR (CATHETERS) ×2
CATH FOLEY 2WAY SLVR 5CC 12FR (CATHETERS) IMPLANT
CATH FOLEY LATEX FREE 14FR (CATHETERS) ×3
CATH FOLEY LF 14FR (CATHETERS) IMPLANT
CHLORAPREP W/TINT 26ML (MISCELLANEOUS) ×3 IMPLANT
CLOTH BEACON ORANGE TIMEOUT ST (SAFETY) ×3 IMPLANT
COVER LIGHT HANDLE STERIS (MISCELLANEOUS) ×6 IMPLANT
CUTTER FLEX LINEAR 45M (STAPLE) ×3 IMPLANT
DECANTER SPIKE VIAL GLASS SM (MISCELLANEOUS) ×3 IMPLANT
ELECT REM PT RETURN 9FT ADLT (ELECTROSURGICAL) ×3
ELECTRODE REM PT RTRN 9FT ADLT (ELECTROSURGICAL) ×1 IMPLANT
EVACUATOR SMOKE 8.L (FILTER) ×3 IMPLANT
GLOVE BIO SURGEON STRL SZ7 (GLOVE) ×3 IMPLANT
GLOVE BIOGEL PI IND STRL 7.0 (GLOVE) ×2 IMPLANT
GLOVE BIOGEL PI IND STRL 7.5 (GLOVE) IMPLANT
GLOVE BIOGEL PI INDICATOR 7.0 (GLOVE) ×8
GLOVE BIOGEL PI INDICATOR 7.5 (GLOVE) ×4
GLOVE SURG SS PI 7.5 STRL IVOR (GLOVE) ×3 IMPLANT
GOWN STRL REUS W/ TWL XL LVL3 (GOWN DISPOSABLE) ×1 IMPLANT
GOWN STRL REUS W/TWL LRG LVL3 (GOWN DISPOSABLE) ×3 IMPLANT
GOWN STRL REUS W/TWL XL LVL3 (GOWN DISPOSABLE) ×3
INST SET LAPROSCOPIC AP (KITS) ×3 IMPLANT
IV NS IRRIG 3000ML ARTHROMATIC (IV SOLUTION) IMPLANT
KIT TURNOVER KIT A (KITS) ×3 IMPLANT
MANIFOLD NEPTUNE II (INSTRUMENTS) ×3 IMPLANT
NDL HYPO 18GX1.5 BLUNT FILL (NEEDLE) ×1 IMPLANT
NDL INSUFFLATION 14GA 120MM (NEEDLE) ×1 IMPLANT
NEEDLE HYPO 18GX1.5 BLUNT FILL (NEEDLE) ×3 IMPLANT
NEEDLE HYPO 22GX1.5 SAFETY (NEEDLE) ×3 IMPLANT
NEEDLE INSUFFLATION 14GA 120MM (NEEDLE) ×3 IMPLANT
NS IRRIG 1000ML POUR BTL (IV SOLUTION) ×3 IMPLANT
PACK LAP CHOLE LZT030E (CUSTOM PROCEDURE TRAY) ×3 IMPLANT
PAD ARMBOARD 7.5X6 YLW CONV (MISCELLANEOUS) ×3 IMPLANT
PENCIL HANDSWITCHING (ELECTRODE) ×3 IMPLANT
RELOAD 45 VASCULAR/THIN (ENDOMECHANICALS) ×3 IMPLANT
RELOAD STAPLE 45 2.5 WHT GRN (ENDOMECHANICALS) IMPLANT
RELOAD STAPLE TA45 3.5 REG BLU (ENDOMECHANICALS) ×3 IMPLANT
SET BASIN LINEN APH (SET/KITS/TRAYS/PACK) ×3 IMPLANT
SET TUBE IRRIG SUCTION NO TIP (IRRIGATION / IRRIGATOR) IMPLANT
SHEARS HARMONIC ACE PLUS 36CM (ENDOMECHANICALS) ×3 IMPLANT
SPONGE GAUZE 2X2 8PLY STER LF (GAUZE/BANDAGES/DRESSINGS) ×1
SPONGE GAUZE 2X2 8PLY STRL LF (GAUZE/BANDAGES/DRESSINGS) ×2 IMPLANT
STAPLER VISISTAT (STAPLE) ×3 IMPLANT
SUT VICRYL 0 UR6 27IN ABS (SUTURE) ×3 IMPLANT
SYR 20CC LL (SYRINGE) ×6 IMPLANT
SYS BAG RETRIEVAL 10MM (BASKET) ×1
SYSTEM BAG RETRIEVAL 10MM (BASKET) ×1 IMPLANT
TAPE PAPER 2X10 WHT MICROPORE (GAUZE/BANDAGES/DRESSINGS) ×2 IMPLANT
TRAY FOLEY W/BAG SLVR 16FR (SET/KITS/TRAYS/PACK) ×3
TRAY FOLEY W/BAG SLVR 16FR ST (SET/KITS/TRAYS/PACK) ×1 IMPLANT
TROCAR ENDO BLADELESS 11MM (ENDOMECHANICALS) ×3 IMPLANT
TROCAR ENDO BLADELESS 12MM (ENDOMECHANICALS) ×3 IMPLANT
TROCAR XCEL NON-BLD 5MMX100MML (ENDOMECHANICALS) ×3 IMPLANT
TUBING INSUFFLATION (TUBING) ×3 IMPLANT
WARMER LAPAROSCOPE (MISCELLANEOUS) ×3 IMPLANT
YANKAUER SUCT 12FT TUBE ARGYLE (SUCTIONS) ×3 IMPLANT

## 2017-12-19 NOTE — Progress Notes (Signed)
MD aware patient testing positive for staph Aureas.  NO new orders received.

## 2017-12-19 NOTE — Discharge Instructions (Signed)
Laparoscopic Appendectomy, Adult, Care After °Refer to this sheet in the next few weeks. These instructions provide you with information about caring for yourself after your procedure. Your health care provider may also give you more specific instructions. Your treatment has been planned according to current medical practices, but problems sometimes occur. Call your health care provider if you have any problems or questions after your procedure. °What can I expect after the procedure? °After the procedure, it is common to have: °· A decrease in your energy level. °· Mild pain in the area where the surgical cuts (incisions) were made. °· Constipation. This can be caused by pain medicine and a decrease in your activity. ° °Follow these instructions at home: °Medicines °· Take over-the-counter and prescription medicines only as told by your health care provider. °· Do not drive for 24 hours if you received a sedative. °· Do not drive or operate heavy machinery while taking prescription pain medicine. °· If you were prescribed an antibiotic medicine, take it as told by your health care provider. Do not stop taking the antibiotic even if you start to feel better. °Activity °· For 3 weeks or as long as told by your health care provider: °? Do not lift anything that is heavier than 10 pounds (4.5 kg). °? Do not play contact sports. °· Gradually return to your normal activities. Ask your health care provider what activities are safe for you. °Bathing °· Keep your incisions clean and dry. Clean them as often as told by your health care provider: °? Gently wash the incisions with soap and water. °? Rinse the incisions with water to remove all soap. °? Pat the incisions dry with a clean towel. Do not rub the incisions. °· You may take showers after 48 hours. °· Do not take baths, swim, or use hot tubs for 2 weeks or as told by your health care provider. °Incision care °· Follow instructions from your healthcare provider about  how to take care of your incisions. Make sure you: °? Wash your hands with soap and water before you change your bandage (dressing). If soap and water are not available, use hand sanitizer. °? Change your dressing as told by your health care provider. °? Leave stitches (sutures), skin glue, or adhesive strips in place. These skin closures may need to stay in place for 2 weeks or longer. If adhesive strip edges start to loosen and curl up, you may trim the loose edges. Do not remove adhesive strips completely unless your health care provider tells you to do that. °· Check your incision areas every day for signs of infection. Check for: °? More redness, swelling, or pain. °? More fluid or blood. °? Warmth. °? Pus or a bad smell. °Other Instructions °· If you were sent home with a drain, follow instructions from your health care provider about how to care for the drain and how to empty it. °· Take deep breaths. This helps to prevent your lungs from becoming inflamed. °· To relieve and prevent constipation: °? Drink plenty of fluids. °? Eat plenty of fruits and vegetables. °· Keep all follow-up visits as told by your health care provider. This is important. °Contact a health care provider if: °· You have more redness, swelling, or pain around an incision. °· You have more fluid or blood coming from an incision. °· Your incision feels warm to the touch. °· You have pus or a bad smell coming from an incision or dressing. °· Your incision   edges break open after your sutures have been removed. °· You have increasing pain in your shoulders. °· You feel dizzy or you faint. °· You develop shortness of breath. °· You keep feeling nauseous or vomiting. °· You have diarrhea or you cannot control your bowel functions. °· You lose your appetite. °· You develop swelling or pain in your legs. °Get help right away if: °· You have a fever. °· You develop a rash. °· You have difficulty breathing. °· You have sharp pains in your  chest. °This information is not intended to replace advice given to you by your health care provider. Make sure you discuss any questions you have with your health care provider. °Document Released: 03/18/2005 Document Revised: 08/18/2015 Document Reviewed: 09/05/2014 °Elsevier Interactive Patient Education © 2018 [employer] ° °

## 2017-12-19 NOTE — Progress Notes (Signed)
ANTIBIOTIC CONSULT NOTE-Preliminary  Pharmacy Consult for Ciprofloxacin Indication: Intra-abdominal infection  No Known Allergies  Patient Measurements: Height: 5\' 8"  (172.7 cm) Weight: 221 lb 5.5 oz (100.4 kg) IBW/kg (Calculated) : 68.4  Vital Signs: Temp: 98.4 F (36.9 C) (09/20 0202) Temp Source: Oral (09/20 0202) BP: 164/106 (09/20 0202) Pulse Rate: 93 (09/20 0202)  Labs: Recent Labs    12/18/17 2012  WBC 5.9  HGB 14.7  PLT 308  CREATININE 0.92    Estimated Creatinine Clearance: 121.4 mL/min (by C-G formula based on SCr of 0.92 mg/dL).  No results for input(s): VANCOTROUGH, VANCOPEAK, VANCORANDOM, GENTTROUGH, GENTPEAK, GENTRANDOM, TOBRATROUGH, TOBRAPEAK, TOBRARND, AMIKACINPEAK, AMIKACINTROU, AMIKACIN in the last 72 hours.   Microbiology: No results found for this or any previous visit (from the past 720 hour(s)).  Medical History: Past Medical History:  Diagnosis Date  . Dystonia   . Tremor     Medications:  Ceftriaxone 2 Gm IV x 1 dose ordered by the EDP Metronidazole 500 mg IV 9/19 >>  Assessment: 42 yo male with complaints of abdominal pain x 2-3 days. CT shows appendicitis with recommendation for surgery . Pharmacy has to be consulted for ciprofloxacin dosing.  Goal of Therapy:  Eradicate infection  Plan:  Preliminary review of pertinent patient information completed.  Protocol will be initiated with one dose of ciprofloxacin 400 mg IV.  [facility] clinical pharmacist will complete review during morning rounds to assess patient and finalize treatment regimen if needed.  Arelia SneddonMason, Taylen Osorto Anne, [facility] IslandRPH 12/19/2017,2:24 AM

## 2017-12-19 NOTE — Progress Notes (Signed)
Pharmacy Antibiotic Note  Aaron LikensCharles K Coffey is a 42 y.o. male admitted on 12/18/2017 with appendicitis.   Pharmacy has been consulted for Cipro dosing.  Plan: Start Cipro 400mg  IV q12h Pharmacy will continue to monitor labs, cultures and patient progress.  Height: 5\' 8"  (172.7 cm) Weight: 221 lb 5.5 oz (100.4 kg) IBW/kg (Calculated) : 68.4  Temp (24hrs), Avg:98.5 F (36.9 C), Min:98.1 F (36.7 C), Max:99.1 F (37.3 C)  Recent Labs  Lab 12/18/17 2012 12/19/17 0511  WBC 5.9 5.6  CREATININE 0.92 0.67    Estimated Creatinine Clearance: 139.6 mL/min (by C-G formula based on SCr of 0.67 mg/dL).    No Known Allergies  Antimicrobials this admission: Metronidazole 9/19  >>  Ciprofloxacin 9/19 >>    Microbiology results: 9/20 Surgical PCR: positive  9/20 MRSA PCR: negative  Thank you for allowing pharmacy to be a part of this patient's care.  Aaron Coffey 12/19/2017 8:52 AM

## 2017-12-19 NOTE — Progress Notes (Signed)
Patient discharged home.  IV removed - WNL.  Reviewed AVS and medications.  Instructed to follow up with PCP or Lovell SheehanJenkins.  Educated on incisional care and s/s of infection and when to call MD. Verbalized understanding.  No questions at this time.  Patient in NAD

## 2017-12-19 NOTE — Transfer of Care (Signed)
Immediate Anesthesia Transfer of Care Note  Patient: Aaron Coffey  Procedure(s) Performed: APPENDECTOMY LAPAROSCOPIC (N/A )  Patient Location: PACU  Anesthesia Type:General  Level of Consciousness: awake and patient cooperative  Airway & Oxygen Therapy: Patient Spontanous Breathing  Post-op Assessment: Report given to RN and Post -op Vital signs reviewed and stable  Post vital signs: Reviewed and stable  Last Vitals:  Vitals Value Taken Time  BP 154/97 12/19/2017 12:48 PM  Temp    Pulse 113 12/19/2017 12:49 PM  Resp 14 12/19/2017 12:49 PM  SpO2 93 % 12/19/2017 12:49 PM  Vitals shown include unvalidated device data.  Last Pain:  Vitals:   12/19/17 1025  TempSrc: Axillary  PainSc: 0-No pain         Complications: No apparent anesthesia complications

## 2017-12-19 NOTE — Op Note (Signed)
Patient:  Aaron LikensCharles K Sawyer  DOB:  11/07/1975  MRN:  [id number]   Preop Diagnosis: Acute appendicitis  Postop Diagnosis: Same  Procedure: Laparoscopic appendectomy  Surgeon: Franky MachoMark Yousra Ivens, MD  Anes: General endotracheal  Indications: Patient is a 42 year old white male who presents with right lower quadrant abdominal pain secondary to acute appendicitis.  The risks and benefits of the procedure including bleeding, infection, and the possibility of an open procedure were fully explained to the patient and mother, who gave informed consent due to patient's mental status deficiencies.  Procedure note: The patient was placed in supine position.  After general anesthesia was administered, the abdomen was prepped and draped using the usual sterile technique with ChloraPrep.  Surgical site confirmation was performed.  A supraumbilical incision was made down to the fascia.  A Veress needle was introduced into the abdominal cavity and confirmation of placement was done using the saline drop test.  The abdomen was then insufflated to 16 mmHg pressure.  An 11 mm trocar was introduced into the abdominal cavity under direct visualization without difficulty.  The patient was placed in deeper Trendelenburg position and an additional 12 mm trocar was placed in the suprapubic region and a 5 mm trocar was placed left lower quadrant region.  The appendix was visualized and noted to be mildly inflamed.  The mesoappendix was divided using the harmonic scalpel.  A standard Endo GIA was placed across the juncture of the appendix to the cecum and fired without difficulty.  The appendix was then removed using an Endo Catch bag.  The staple line was inspected and noted to be within normal limits.  All fluid and air were then evacuated from the abdominal cavity prior to the removal of the trochars.  All wounds were irrigated with normal saline.  All wounds were injected with Exparel.  The suprapubic fascia was  reapproximated using an 0 Vicryl interrupted suture.  All skin incisions were closed using staples.  Betadine ointment and dry sterile dressings were applied.  All tape and needle counts were correct at the end of the procedure.  The patient was extubated in the operating room and transferred to PACU in stable condition.  Complications: None  EBL: Minimal  Specimen: Appendix

## 2017-12-19 NOTE — Care Management Important Message (Signed)
Important Message  Patient Details  Name: Aaron Coffey MRN: [id number] Date of Birth: 04/29/1975   Medicare Important Message Given:  Yes    [name], [name] 12/19/2017, 10:36 AM

## 2017-12-19 NOTE — H&P (Addendum)
TRH H&P    Patient Demographics:    Masson Nalepa, is a 42 y.o. male  MRN: [id number]  DOB - 1975/10/12  Admit Date - 12/18/2017  Referring MD/NP/PA: Dr. Dayna Barker  Outpatient Primary MD for the patient is Redmond School, MD  Patient coming from: Home  Chief complaint-abdominal pain   HPI:    Cabot Cromartie  is a 42 y.o. male, with history of dystonia, tremor, cholecystectomy who came to the ED with complaints of abdominal pain.  Pain has been going on for past 2 to 3 days.  He denies nausea vomiting diarrhea.  Denies constipation.  No fever at home.  Denies dysuria urgency or frequency of urination.  No chest pain or shortness of breath.  As per mother patient's appetite has been reduced over the past few days. In the ED, CT scan showed appendicitis without obvious complication.  General surgery was consulted by ED physician and he recommended hospital admission with surgery in a.m.    Review of systems:    In addition to the HPI above,    All other systems reviewed and are negative.   With Past History of the following :    Past Medical History:  Diagnosis Date  . Dystonia   . Tremor       Past Surgical History:  Procedure Laterality Date  . CHOLECYSTECTOMY        Social History:      Social History   Tobacco Use  . Smoking status: Never Smoker  . Smokeless tobacco: Never Used  Substance Use Topics  . Alcohol use: Yes    Comment: occ       Family History :   Noncontributory   Home Medications:   Prior to Admission medications   Medication Sig Start Date End Date Taking? Authorizing Provider  cephALEXin (KEFLEX) 500 MG capsule Take 1 capsule (500 mg total) by mouth 4 (four) times daily. 08/31/17   Francine Graven, DO  clotrimazole (LOTRIMIN) 1 % cream Apply to affected area 2 times daily for the next 7 days 08/31/17   Francine Graven, DO  Omega-3 Fatty Acids (FISH OIL  PO) Take 1 capsule by mouth daily.    [provider]  primidone (MYSOLINE) 50 MG tablet Take 100 mg by mouth 2 (two) times daily. 01/10/14   [provider]  simvastatin (ZOCOR) 20 MG tablet Take 20 mg by mouth every evening. 01/10/14   [provider]     Allergies:    No Known Allergies   Physical Exam:   Vitals  Blood pressure (!) 155/113, pulse (!) 104, temperature 99.1 F (37.3 C), temperature source Oral, resp. rate 16, height 5' 6" (1.676 m), weight 99.3 kg, SpO2 99 %.  1.  General: Appears in no acute distress  2. Psychiatric:  Intact judgement and  insight, awake alert, oriented x 3.  3. Neurologic: No focal neurological deficits, all cranial nerves intact.Strength 5/5 all 4 extremities, sensation intact all 4 extremities, plantars down going.  4. Eyes :  anicteric sclerae, moist conjunctivae with  no lid lag. PERRLA.  5. ENMT:  Oropharynx clear with moist mucous membranes and good dentition  6. Neck:  supple, no cervical lymphadenopathy appriciated, No thyromegaly  7. Respiratory : Normal respiratory effort, good air movement bilaterally,clear to  auscultation bilaterally  8. Cardiovascular : RRR, no gallops, rubs or murmurs, no leg edema  9. Gastrointestinal:  Positive bowel sounds, abdomen soft, tender to palpation in umbilical region,,no hepatosplenomegaly, no rigidity or guarding       10. Skin:  No cyanosis, normal texture and turgor, no rash, lesions or ulcers  11.Musculoskeletal:  Good muscle tone,  joints appear normal , no effusions,  normal range of motion    Data Review:    CBC Recent Labs  Lab 12/18/17 2012  WBC 5.9  HGB 14.7  HCT 41.7  PLT 308  MCV 81.0  MCH 28.5  MCHC 35.3  RDW 12.9   ------------------------------------------------------------------------------------------------------------------  Results for orders placed or performed during the hospital encounter of 12/18/17 (from the past 48  hour(s))  Urinalysis, Routine w reflex microscopic     Status: Abnormal   Collection Time: 12/18/17  7:46 PM  Result Value Ref Range   Color, Urine AMBER (A) YELLOW    Comment: BIOCHEMICALS MAY BE AFFECTED BY COLOR   APPearance CLOUDY (A) CLEAR   Specific Gravity, Urine 1.026 1.005 - 1.030   pH 5.0 5.0 - 8.0   Glucose, UA >=500 (A) NEGATIVE mg/dL   Hgb urine dipstick NEGATIVE NEGATIVE   Bilirubin Urine NEGATIVE NEGATIVE   Ketones, ur 5 (A) NEGATIVE mg/dL   Protein, ur 30 (A) NEGATIVE mg/dL   Nitrite NEGATIVE NEGATIVE   Leukocytes, UA TRACE (A) NEGATIVE   RBC / HPF 0-5 0 - 5 RBC/hpf   WBC, UA 6-10 0 - 5 WBC/hpf   Bacteria, UA NONE SEEN NONE SEEN   Squamous Epithelial / LPF 0-5 0 - 5   Mucus PRESENT     Comment: Performed at [facility], [address], [place], [place] [address]  Lipase, blood     Status: None   Collection Time: 12/18/17  8:12 PM  Result Value Ref Range   Lipase 24 11 - 51 U/L    Comment: Performed at [facility], [address] [place], [place] [address]  Comprehensive metabolic panel     Status: Abnormal   Collection Time: 12/18/17  8:12 PM  Result Value Ref Range   Sodium 137 135 - 145 mmol/L   Potassium 3.2 (L) 3.5 - 5.1 mmol/L   Chloride 103 98 - 111 mmol/L   CO2 25 22 - 32 mmol/L   Glucose, Bld 277 (H) 70 - 99 mg/dL   BUN 11 6 - 20 mg/dL   Creatinine, Ser 0.92 0.61 - 1.24 mg/dL   Calcium 9.1 8.9 - 10.3 mg/dL   Total Protein 7.7 6.5 - 8.1 g/dL   Albumin 4.2 3.5 - 5.0 g/dL   AST 30 15 - 41 U/L   ALT 32 0 - 44 U/L   Alkaline Phosphatase 79 38 - 126 U/L   Total Bilirubin 0.8 0.3 - 1.2 mg/dL   GFR calc non Af Amer >60 >60 mL/min   GFR calc Af Amer >60 >60 mL/min    Comment: (NOTE) The eGFR has been calculated using the CKD EPI equation. This calculation has not been validated in all clinical situations. eGFR's persistently <60 mL/min signify possible Chronic Kidney Disease.    Anion gap 9 5 - 15    Comment: Performed at  [facility] [place], [place] [address]  CBC     Status: None   Collection Time: 12/18/17  8:12 PM  Result Value Ref Range   WBC 5.9 4.0 - 10.5 K/uL   RBC 5.15 4.22 - 5.81 MIL/uL   Hemoglobin 14.7 13.0 - 17.0 g/dL   HCT 41.7 39.0 - 52.0 %   MCV 81.0 78.0 - 100.0 fL   MCH 28.5 26.0 - 34.0 pg   MCHC 35.3 30.0 - 36.0 g/dL   RDW 12.9 11.5 - 15.5 %   Platelets 308 150 - 400 K/uL    Comment: Performed at [facility], [address] [place], [place] [address]    Chemistries  Recent Labs  Lab 12/18/17 2012  NA 137  K 3.2*  CL 103  CO2 25  GLUCOSE 277*  BUN 11  CREATININE 0.92  CALCIUM 9.1  AST 30  ALT 32  ALKPHOS 79  BILITOT 0.8   ------------------------------------------------------------------------------------------------------------------  ------------------------------------------------------------------------------------------------------------------ GFR: Estimated Creatinine Clearance: 116.6 mL/min (by C-G formula based on SCr of 0.92 mg/dL). Liver Function Tests: Recent Labs  Lab 12/18/17 2012  AST 30  ALT 32  ALKPHOS 79  BILITOT 0.8  PROT 7.7  ALBUMIN 4.2   Recent Labs  Lab 12/18/17 2012  LIPASE 24   No results for input(s): AMMONIA in the last 168 hours. Coagulation Profile: No results for input(s): INR, PROTIME in the last 168 hours. Cardiac Enzymes: No results for input(s): CKTOTAL, CKMB, CKMBINDEX, TROPONINI in the last 168 hours. BNP (last 3 results) No results for input(s): PROBNP in the last 8760 hours. HbA1C: No results for input(s): HGBA1C in the last 72 hours. CBG: No results for input(s): GLUCAP in the last 168 hours. Lipid Profile: No results for input(s): CHOL, HDL, LDLCALC, TRIG, CHOLHDL, LDLDIRECT in the last 72 hours. Thyroid Function Tests: No results for input(s): TSH, T4TOTAL, FREET4, T3FREE, THYROIDAB in the last 72 hours. Anemia Panel: No results for input(s): VITAMINB12, FOLATE, FERRITIN, TIBC, IRON, RETICCTPCT in the last 72  hours.  --------------------------------------------------------------------------------------------------------------- Urine analysis:    Component Value Date/Time   COLORURINE AMBER (A) 12/18/2017 1946   APPEARANCEUR CLOUDY (A) 12/18/2017 1946   LABSPEC 1.026 12/18/2017 1946   PHURINE 5.0 12/18/2017 1946   GLUCOSEU >=500 (A) 12/18/2017 1946   HGBUR NEGATIVE 12/18/2017 1946   BILIRUBINUR NEGATIVE 12/18/2017 1946   KETONESUR 5 (A) 12/18/2017 1946   PROTEINUR 30 (A) 12/18/2017 1946   UROBILINOGEN 0.2 02/28/2014 2236   NITRITE NEGATIVE 12/18/2017 1946   LEUKOCYTESUR TRACE (A) 12/18/2017 1946      Imaging Results:    Ct Abdomen Pelvis W Contrast  Result Date: 12/18/2017 CLINICAL DATA:  Abdominal pain and fever. EXAM: CT ABDOMEN AND PELVIS WITH CONTRAST TECHNIQUE: Multidetector CT imaging of the abdomen and pelvis was performed using the standard protocol following bolus administration of intravenous contrast. CONTRAST:  171m OMNIPAQUE IOHEXOL 300 MG/ML  SOLN COMPARISON:  None. FINDINGS: LOWER CHEST: There is no basilar pleural or apical pericardial effusion. HEPATOBILIARY: Diffuse hypoattenuation of the liver relative to the spleen suggests hepatic steatosis. No focal liver lesion or biliary dilatation. Status post cholecystectomy. PANCREAS: The pancreatic parenchymal contours are normal and there is no ductal dilatation. There is no peripancreatic fluid collection. SPLEEN: Normal. ADRENALS/URINARY TRACT: --Adrenal glands: Normal. --Right kidney/ureter: No hydronephrosis, nephroureterolithiasis, perinephric stranding or solid renal mass. 3 cm right renal cyst. --Left kidney/ureter: No hydronephrosis, nephroureterolithiasis, perinephric stranding or solid renal mass. --Urinary bladder: Normal for degree of distention  STOMACH/BOWEL: --Stomach/Duodenum: There is no hiatal hernia or other gastric abnormality. The duodenal course and caliber are normal. --Small bowel: No dilatation or inflammation.  --Colon: No focal abnormality. Appendix: Location: 6 o'clock position relative to the cecum. Diameter: 8 mm Appendicolith: None Mucosal hyper-enhancement: None Extraluminal gas: None Periappendiceal collection: Periappendiceal stranding without fluid collection. VASCULAR/LYMPHATIC: Normal course and caliber of the major abdominal vessels. No abdominal or pelvic lymphadenopathy. REPRODUCTIVE: No free fluid in the pelvis. MUSCULOSKELETAL. No bony spinal canal stenosis or focal osseous abnormality. OTHER: None. IMPRESSION: Mildly dilated appendix with adjacent stranding suggestive of early acute appendicitis. No free intraperitoneal air or fluid collection. Electronically Signed   By: Ulyses Jarred M.D.   On: 12/18/2017 22:43       Assessment & Plan:    Active Problems:   Appendicitis   1. Appendicitis-CT scan of the abdomen pelvis showed mildly pelvic appendix with adjacent stranding suggestive of early acute appendicitis.  No free intraperitoneal air or fluid collection.  We will keep n.p.o., IV normal saline at 100 mL/h,  will start Cipro per pharmacy consultation, continue Flagyl 500 mg IV every 8 hours.  General surgery has been consulted by ED physician, Dr. Arnoldo Morale to operate in a.m.  2. Hypokalemia-potassium is 3.2, will replace potassium and check BMP in am.   DVT Prophylaxis-   Lovenox   AM Labs Ordered, also please review Full Orders  Family Communication: Admission, patients condition and plan of care including tests being ordered have been discussed with the patient and his mother at bedside who indicate understanding and agree with the plan and Code Status.  Code Status: Full code  Admission status: Inpatient  Time spent in minutes : 60 minutes   Oswald Hillock M.D on 12/19/2017 at 1:24 AM  Between 7am to 7pm - Pager - [phone]. After 7pm go to [email or web address] - password [facility]  [facility] - Office  [phone]

## 2017-12-19 NOTE — Discharge Summary (Signed)
Physician Discharge Summary  Patient ID: Aaron LikensCharles K Coffey MRN: [id number] DOB/AGE: 41/06/1975 41 y.o.  Admit date: 12/18/2017 Discharge date: 12/19/2017  Admission Diagnoses: Acute appendicitis  Discharge Diagnoses: Same Active Problems:   Appendicitis   Discharged Condition: good  Hospital Course: Patient is a 42 year old white male who presented to the emergency room with a 2 to 3-day history of worsening right lower quadrant abdominal pain.  CT scan of the abdomen revealed acute appendicitis.  The patient underwent a laparoscopic appendectomy on 12/19/2017.  Tolerated the procedure well.  His postoperative course was unremarkable.  His diet was advanced without difficulty.  The patient is being discharged home on 12/19/2017 in good and improving condition.  Treatments: surgery: Laparoscopic appendectomy on 12/19/2017  Discharge Exam: Blood pressure 125/75, pulse 99, temperature 99 F (37.2 C), resp. rate 18, height 5\' 8"  (1.727 m), weight 100.4 kg, SpO2 95 %. General appearance: alert, cooperative and no distress Resp: clear to auscultation bilaterally Cardio: regular rate and rhythm, S1, S2 normal, no murmur, click, rub or gallop GI: Soft, incisions healing well.  Disposition: Discharge disposition: 01-Home or Self Care       Discharge Instructions    Diet - low sodium heart healthy   Complete by:  As directed    Increase activity slowly   Complete by:  As directed      Allergies as of 12/19/2017   No Known Allergies     Medication List    STOP taking these medications   cephALEXin 500 MG capsule Commonly known as:  KEFLEX     TAKE these medications   clotrimazole 1 % cream Commonly known as:  LOTRIMIN Apply to affected area 2 times daily for the next 7 days   FISH OIL PO Take 1 capsule by mouth daily.   HYDROcodone-acetaminophen 5-325 MG tablet Commonly known as:  NORCO/VICODIN Take 1 tablet by mouth every 4 (four) hours as needed for moderate pain.    primidone 50 MG tablet Commonly known as:  MYSOLINE Take 100 mg by mouth 2 (two) times daily.   simvastatin 20 MG tablet Commonly known as:  ZOCOR Take 20 mg by mouth every evening.   trihexyphenidyl 2 MG tablet Commonly known as:  ARTANE Take 1.5 tablets by mouth daily.      Follow-up Information    Aaron Coffey, Aaron Manske, MD. Schedule an appointment as soon as possible for a visit on 12/30/2017.   Specialty:  General Surgery Why:  If you can't make [facility] appt on 9/30 Contact information: [address] [place] [id number] [phone]           Signed: Franky MachoMark Aaminah Coffey 12/19/2017, 3:30 PM

## 2017-12-19 NOTE — Consult Note (Signed)
Reason for Consult: Right lower quadrant abdominal pain Referring Physician: Dr. Jeanella Cara JERAMIAH MCCAUGHEY is an 42 y.o. male.  HPI: Patient is a 42 year old white male who presented to the emergency room with 3-day history of worsening right lower quadrant abdominal pain.  Decreased appetite was noted.  CT scan of the abdomen revealed acute appendicitis.  The patient was admitted to the hospital for further evaluation and treatment.  This morning, the patient has 3 out of 10 abdominal pain.  No nausea or vomiting have been noted.  Past Medical History:  Diagnosis Date  . Dystonia   . Tremor     Past Surgical History:  Procedure Laterality Date  . CHOLECYSTECTOMY      No family history on file.  Social History:  reports that he has never smoked. He has never used smokeless tobacco. He reports that he drinks alcohol. He reports that he does not use drugs.  Allergies: No Known Allergies  Medications: I have reviewed the patient's current medications.  Results for orders placed or performed during the hospital encounter of 12/18/17 (from the past 48 hour(s))  Urinalysis, Routine w reflex microscopic     Status: Abnormal   Collection Time: 12/18/17  7:46 PM  Result Value Ref Range   Color, Urine AMBER (A) YELLOW    Comment: BIOCHEMICALS MAY BE AFFECTED BY COLOR   APPearance CLOUDY (A) CLEAR   Specific Gravity, Urine 1.026 1.005 - 1.030   pH 5.0 5.0 - 8.0   Glucose, UA >=500 (A) NEGATIVE mg/dL   Hgb urine dipstick NEGATIVE NEGATIVE   Bilirubin Urine NEGATIVE NEGATIVE   Ketones, ur 5 (A) NEGATIVE mg/dL   Protein, ur 30 (A) NEGATIVE mg/dL   Nitrite NEGATIVE NEGATIVE   Leukocytes, UA TRACE (A) NEGATIVE   RBC / HPF 0-5 0 - 5 RBC/hpf   WBC, UA 6-10 0 - 5 WBC/hpf   Bacteria, UA NONE SEEN NONE SEEN   Squamous Epithelial / LPF 0-5 0 - 5   Mucus PRESENT     Comment: Performed at [facility], [address], [place], [place] [address]  Lipase, blood     Status: None   Collection  Time: 12/18/17  8:12 PM  Result Value Ref Range   Lipase 24 11 - 51 U/L    Comment: Performed at [facility], [address] [place], [place] [address]  Comprehensive metabolic panel     Status: Abnormal   Collection Time: 12/18/17  8:12 PM  Result Value Ref Range   Sodium 137 135 - 145 mmol/L   Potassium 3.2 (L) 3.5 - 5.1 mmol/L   Chloride 103 98 - 111 mmol/L   CO2 25 22 - 32 mmol/L   Glucose, Bld 277 (H) 70 - 99 mg/dL   BUN 11 6 - 20 mg/dL   Creatinine, Ser 0.92 0.61 - 1.24 mg/dL   Calcium 9.1 8.9 - 10.3 mg/dL   Total Protein 7.7 6.5 - 8.1 g/dL   Albumin 4.2 3.5 - 5.0 g/dL   AST 30 15 - 41 U/L   ALT 32 0 - 44 U/L   Alkaline Phosphatase 79 38 - 126 U/L   Total Bilirubin 0.8 0.3 - 1.2 mg/dL   GFR calc non Af Amer >60 >60 mL/min   GFR calc Af Amer >60 >60 mL/min    Comment: (NOTE) The eGFR has been calculated using the CKD EPI equation. This calculation has not been validated in all clinical situations. eGFR's persistently <60 mL/min signify possible  Chronic Kidney Disease.    Anion gap 9 5 - 15    Comment: Performed at [facility], [address] [place], [place] [address]  CBC     Status: None   Collection Time: 12/18/17  8:12 PM  Result Value Ref Range   WBC 5.9 4.0 - 10.5 K/uL   RBC 5.15 4.22 - 5.81 MIL/uL   Hemoglobin 14.7 13.0 - 17.0 g/dL   HCT 41.7 39.0 - 52.0 %   MCV 81.0 78.0 - 100.0 fL   MCH 28.5 26.0 - 34.0 pg   MCHC 35.3 30.0 - 36.0 g/dL   RDW 12.9 11.5 - 15.5 %   Platelets 308 150 - 400 K/uL    Comment: Performed at [facility], [address], [place], [place] [address]  Surgical PCR screen     Status: Abnormal   Collection Time: 12/19/17  2:43 AM  Result Value Ref Range   MRSA, PCR NEGATIVE NEGATIVE   Staphylococcus aureus POSITIVE (A) NEGATIVE    Comment: (NOTE) The Xpert SA Assay (FDA approved for NASAL specimens in patients 21 years of age and older), is one component of a comprehensive surveillance program. It is not intended to diagnose  infection nor to guide or monitor treatment. Performed at [facility], [address] [place], [place] [address]   CBC     Status: Abnormal   Collection Time: 12/19/17  5:11 AM  Result Value Ref Range   WBC 5.6 4.0 - 10.5 K/uL   RBC 4.62 4.22 - 5.81 MIL/uL   Hemoglobin 13.1 13.0 - 17.0 g/dL   HCT 38.1 (L) 39.0 - 52.0 %   MCV 82.5 78.0 - 100.0 fL   MCH 28.4 26.0 - 34.0 pg   MCHC 34.4 30.0 - 36.0 g/dL   RDW 12.7 11.5 - 15.5 %   Platelets 266 150 - 400 K/uL    Comment: Performed at [facility], [address], [place], [place] [address]  Comprehensive metabolic panel     Status: Abnormal   Collection Time: 12/19/17  5:11 AM  Result Value Ref Range   Sodium 139 135 - 145 mmol/L   Potassium 3.3 (L) 3.5 - 5.1 mmol/L   Chloride 106 98 - 111 mmol/L   CO2 25 22 - 32 mmol/L   Glucose, Bld 166 (H) 70 - 99 mg/dL   BUN 9 6 - 20 mg/dL   Creatinine, Ser 0.67 0.61 - 1.24 mg/dL   Calcium 8.5 (L) 8.9 - 10.3 mg/dL   Total Protein 6.8 6.5 - 8.1 g/dL   Albumin 3.5 3.5 - 5.0 g/dL   AST 24 15 - 41 U/L   ALT 28 0 - 44 U/L   Alkaline Phosphatase 64 38 - 126 U/L   Total Bilirubin 0.6 0.3 - 1.2 mg/dL   GFR calc non Af Amer >60 >60 mL/min   GFR calc Af Amer >60 >60 mL/min    Comment: (NOTE) The eGFR has been calculated using the CKD EPI equation. This calculation has not been validated in all clinical situations. eGFR's persistently <60 mL/min signify possible Chronic Kidney Disease.    Anion gap 8 5 - 15    Comment: Performed at [facility], [address], [place], [place] [address]    Ct Abdomen Pelvis W Contrast  Result Date: 12/18/2017 CLINICAL DATA:  Abdominal pain and fever. EXAM: CT ABDOMEN AND PELVIS WITH CONTRAST TECHNIQUE: Multidetector CT imaging of the abdomen and pelvis was performed using the standard protocol following bolus administration  of intravenous contrast. CONTRAST:  175m OMNIPAQUE IOHEXOL 300 MG/ML  SOLN COMPARISON:  None. FINDINGS: LOWER CHEST: There is no basilar  pleural or apical pericardial effusion. HEPATOBILIARY: Diffuse hypoattenuation of the liver relative to the spleen suggests hepatic steatosis. No focal liver lesion or biliary dilatation. Status post cholecystectomy. PANCREAS: The pancreatic parenchymal contours are normal and there is no ductal dilatation. There is no peripancreatic fluid collection. SPLEEN: Normal. ADRENALS/URINARY TRACT: --Adrenal glands: Normal. --Right kidney/ureter: No hydronephrosis, nephroureterolithiasis, perinephric stranding or solid renal mass. 3 cm right renal cyst. --Left kidney/ureter: No hydronephrosis, nephroureterolithiasis, perinephric stranding or solid renal mass. --Urinary bladder: Normal for degree of distention STOMACH/BOWEL: --Stomach/Duodenum: There is no hiatal hernia or other gastric abnormality. The duodenal course and caliber are normal. --Small bowel: No dilatation or inflammation. --Colon: No focal abnormality. Appendix: Location: 6 o'clock position relative to the cecum. Diameter: 8 mm Appendicolith: None Mucosal hyper-enhancement: None Extraluminal gas: None Periappendiceal collection: Periappendiceal stranding without fluid collection. VASCULAR/LYMPHATIC: Normal course and caliber of the major abdominal vessels. No abdominal or pelvic lymphadenopathy. REPRODUCTIVE: No free fluid in the pelvis. MUSCULOSKELETAL. No bony spinal canal stenosis or focal osseous abnormality. OTHER: None. IMPRESSION: Mildly dilated appendix with adjacent stranding suggestive of early acute appendicitis. No free intraperitoneal air or fluid collection. Electronically Signed   By: KUlyses JarredM.D.   On: 12/18/2017 22:43    ROS:  Pertinent items are noted in HPI.  Blood pressure (!) 145/86, pulse 86, temperature 98.1 F (36.7 C), temperature source Oral, resp. rate 20, height _0  (1.727 m), weight 100.4 kg, SpO2 90 %. Physical Exam: Pleasant white male in no acute distress Head is normocephalic, atraumatic Lungs clear to  auscultation with equal breath sounds bilaterally Heart examination reveals regular rate and rhythm without S3, S4, murmurs Abdomen is soft with discomfort noted to palpation in the right lower quadrant.  Patient's abdomen is somewhat rotund.  No rigidity is noted.  CT scan report reviewed  Assessment/Plan: Impression: Acute appendicitis Plan: Patient will be taken to the operating room this morning for laparoscopic appendectomy.  The risks and benefits of the procedure including bleeding, infection, and the possibility of an open procedure were fully explained to the patient, who gave informed consent.  MAviva Signs9/20/2019, 7:33 AM

## 2017-12-19 NOTE — Anesthesia Procedure Notes (Signed)
Procedure Name: Intubation Date/Time: 12/19/2017 11:42 AM Performed by: Vista Deck, CRNA Pre-anesthesia Checklist: Patient identified, Patient being monitored, Timeout performed, Emergency Drugs available and Suction available Patient Re-evaluated:Patient Re-evaluated prior to induction Oxygen Delivery Method: Circle System Utilized Preoxygenation: Pre-oxygenation with 100% oxygen Induction Type: IV induction Ventilation: Mask ventilation without difficulty Laryngoscope Size: Mac and 3 Grade View: Grade II Tube type: Oral Tube size: 7.0 mm Number of attempts: 1 Airway Equipment and Method: stylet and Oral airway Placement Confirmation: ETT inserted through vocal cords under direct vision,  positive ETCO2 and breath sounds checked- equal and bilateral Secured at: 22 cm Tube secured with: Tape Dental Injury: Teeth and Oropharynx as per pre-operative assessment

## 2017-12-19 NOTE — Anesthesia Preprocedure Evaluation (Signed)
Anesthesia Evaluation  Patient identified by MRN, date of birth, ID band Patient awake    Reviewed: Allergy & Precautions, H&P , NPO status , Patient's Chart, lab work & pertinent test results  Airway Mallampati: II  TM Distance: >3 FB Neck ROM: full    Dental no notable dental hx.    Pulmonary neg pulmonary ROS,    Pulmonary exam normal breath sounds clear to auscultation       Cardiovascular Exercise Tolerance: Good negative cardio ROS   Rhythm:regular Rate:Normal     Neuro/Psych PSYCHIATRIC DISORDERS Intellectual disability Adjustment disorder with mixed disturbance of emotions and conductnegative neurological ROS  negative psych ROS   GI/Hepatic negative GI ROS, Neg liver ROS,   Endo/Other  negative endocrine ROS  Renal/GU negative Renal ROS  negative genitourinary   Musculoskeletal   Abdominal   Peds  Hematology negative hematology ROS (+)   Anesthesia Other Findings   Reproductive/Obstetrics negative OB ROS                             Anesthesia Physical Anesthesia Plan  ASA: II  Anesthesia Plan: General   Post-op Pain Management:    Induction:   PONV Risk Score and Plan:   Airway Management Planned:   Additional Equipment:   Intra-op Plan:   Post-operative Plan:   Informed Consent: I have reviewed the patients History and Physical, chart, labs and discussed the procedure including the risks, benefits and alternatives for the proposed anesthesia with the patient or authorized representative who has indicated his/her understanding and acceptance.   Dental Advisory Given  Plan Discussed with: CRNA  Anesthesia Plan Comments:         Anesthesia Quick Evaluation

## 2017-12-19 NOTE — Anesthesia Postprocedure Evaluation (Signed)
Anesthesia Post Note  Patient: Aaron LikensCharles K Coffey  Procedure(s) Performed: APPENDECTOMY LAPAROSCOPIC (N/A )  Patient location during evaluation: PACU Anesthesia Type: General Level of consciousness: awake and alert and oriented Pain management: pain level controlled Vital Signs Assessment: post-procedure vital signs reviewed and stable Respiratory status: spontaneous breathing Cardiovascular status: blood pressure returned to baseline Postop Assessment: no apparent nausea or vomiting Anesthetic complications: no     Last Vitals:  Vitals:   12/19/17 1315 12/19/17 1340  BP: 135/71 125/75  Pulse: (!) 109 99  Resp: 18   Temp:    SpO2: 95% 95%    Last Pain:  Vitals:   12/19/17 1315  TempSrc:   PainSc: 2                  Josuel Koeppen

## 2017-12-20 LAB — HIV ANTIBODY (ROUTINE TESTING W REFLEX): HIV Screen 4th Generation wRfx: NONREACTIVE

## 2017-12-22 ENCOUNTER — Encounter (HOSPITAL_COMMUNITY): Payer: Self-pay | Admitting: General Surgery

## 2017-12-29 DIAGNOSIS — Z23 Encounter for immunization: Secondary | ICD-10-CM | POA: Diagnosis not present

## 2017-12-29 DIAGNOSIS — Z1389 Encounter for screening for other disorder: Secondary | ICD-10-CM | POA: Diagnosis not present

## 2017-12-29 DIAGNOSIS — Z0000 Encounter for general adult medical examination without abnormal findings: Secondary | ICD-10-CM | POA: Diagnosis not present

## 2017-12-29 DIAGNOSIS — Z6832 Body mass index (BMI) 32.0-32.9, adult: Secondary | ICD-10-CM | POA: Diagnosis not present

## 2017-12-30 ENCOUNTER — Encounter: Payer: Self-pay | Admitting: General Surgery

## 2017-12-30 ENCOUNTER — Ambulatory Visit (INDEPENDENT_AMBULATORY_CARE_PROVIDER_SITE_OTHER): Payer: Self-pay | Admitting: General Surgery

## 2017-12-30 VITALS — BP 170/109 | HR 92 | Temp 96.8°F | Resp 18 | Wt 218.0 lb

## 2017-12-30 DIAGNOSIS — Z09 Encounter for follow-up examination after completed treatment for conditions other than malignant neoplasm: Secondary | ICD-10-CM

## 2017-12-30 NOTE — Progress Notes (Signed)
Subjective:     Aaron Coffey  Status post laparoscopic appendectomy.  Doing well.  Has no complaints. Objective:    BP (!) 170/109 (BP Location: Left Arm, Patient Position: Sitting, Cuff Size: Large)   Pulse 92   Temp (!) 96.8 F (36 C) (Temporal)   Resp 18   Wt 218 lb (98.9 kg)   BMI 33.15 kg/m   General:  alert, cooperative and no distress  Abdomen soft, incisions healing well.  Staples removed, Steri-Strips applied. Final pathology consistent with diagnosis.     Assessment:    Doing well postoperatively.    Plan:   May resume normal activity.  Follow-up here as needed.

## 2018-10-12 DIAGNOSIS — R251 Tremor, unspecified: Secondary | ICD-10-CM | POA: Diagnosis not present

## 2018-10-12 DIAGNOSIS — G252 Other specified forms of tremor: Secondary | ICD-10-CM | POA: Diagnosis not present

## 2018-10-12 DIAGNOSIS — G248 Other dystonia: Secondary | ICD-10-CM | POA: Diagnosis not present

## 2018-10-12 DIAGNOSIS — T50995D Adverse effect of other drugs, medicaments and biological substances, subsequent encounter: Secondary | ICD-10-CM | POA: Diagnosis not present

## 2018-11-01 ENCOUNTER — Emergency Department (HOSPITAL_COMMUNITY): Payer: Medicare Other

## 2018-11-01 ENCOUNTER — Encounter (HOSPITAL_COMMUNITY): Payer: Self-pay | Admitting: Emergency Medicine

## 2018-11-01 ENCOUNTER — Other Ambulatory Visit: Payer: Self-pay

## 2018-11-01 ENCOUNTER — Emergency Department (HOSPITAL_COMMUNITY)
Admission: EM | Admit: 2018-11-01 | Discharge: 2018-11-01 | Disposition: A | Discharge status: Home / Self Care | Payer: Medicare Other | Attending: Emergency Medicine | Admitting: Emergency Medicine

## 2018-11-01 DIAGNOSIS — Z7984 Long term (current) use of oral hypoglycemic drugs: Secondary | ICD-10-CM | POA: Diagnosis not present

## 2018-11-01 DIAGNOSIS — I10 Essential (primary) hypertension: Secondary | ICD-10-CM

## 2018-11-01 DIAGNOSIS — Z79899 Other long term (current) drug therapy: Secondary | ICD-10-CM | POA: Diagnosis not present

## 2018-11-01 DIAGNOSIS — R05 Cough: Secondary | ICD-10-CM | POA: Diagnosis not present

## 2018-11-01 DIAGNOSIS — R079 Chest pain, unspecified: Secondary | ICD-10-CM | POA: Diagnosis not present

## 2018-11-01 LAB — BASIC METABOLIC PANEL WITH GFR
Anion gap: 8 (ref 5–15)
BUN: 10 mg/dL (ref 6–20)
CO2: 27 mmol/L (ref 22–32)
Calcium: 9.4 mg/dL (ref 8.9–10.3)
Chloride: 106 mmol/L (ref 98–111)
Creatinine, Ser: 0.83 mg/dL (ref 0.61–1.24)
GFR calc Af Amer: >60 mL/min
GFR calc non Af Amer: >60 mL/min
Glucose, Bld: 132 mg/dL — ABNORMAL HIGH (ref 70–99)
Potassium: 3.2 mmol/L — ABNORMAL LOW (ref 3.5–5.1)
Sodium: 141 mmol/L (ref 135–145)

## 2018-11-01 LAB — CBC
HCT: 45.9 % (ref 39.0–52.0)
Hemoglobin: 15.5 g/dL (ref 13.0–17.0)
MCH: 27.4 pg (ref 26.0–34.0)
MCHC: 33.8 g/dL (ref 30.0–36.0)
MCV: 81.2 fL (ref 80.0–100.0)
Platelets: 265 10*3/uL (ref 150–400)
RBC: 5.65 MIL/uL (ref 4.22–5.81)
RDW: 13 % (ref 11.5–15.5)
WBC: 4.7 10*3/uL (ref 4.0–10.5)
nRBC: 0 % (ref 0.0–0.2)

## 2018-11-01 LAB — TROPONIN I (HIGH SENSITIVITY): Troponin I (High Sensitivity): 3 ng/L (ref <18)

## 2018-11-01 MED ORDER — AMLODIPINE BESYLATE 5 MG PO TABS: 5.0000 mg | ORAL_TABLET | Freq: Every day | ORAL | 0 refills | Status: DC

## 2018-11-01 MED ORDER — AMLODIPINE BESYLATE 5 MG PO TABS
5.0000 mg | ORAL_TABLET | Freq: Once | ORAL | Status: AC
  Administered 2018-11-01: 22:00:00 5 mg via ORAL
  Filled 2018-11-01: qty 1

## 2018-11-01 NOTE — Discharge Instructions (Signed)
Your caregiver has diagnosed you as having chest pain that is nonspecific for one problem. This means that after looking at you and examining you and ordering tests (such as blood work, chest x-rays and EKG), your caregiver does not believe that the problem is serious enough to need watching in the hospital. This judgment is often made after testing shows no acute heart attack and you are at low risk for sudden acute heart condition. Chest pain comes from many different causes.  Seek immediate medical attention if:  You have severe chest pain, especially if the pain is crushing or pressure-like and spreads to the arms, back, neck, or jaw, or if you have sweating, nausea, shortness of breath. This is an emergency. Don't wait to see if the pain will go away. Get medical help at once. Call 911 immediately. Do not drive herself to the hospital.  Your chest pain gets worse and does not go away with rest.  You have an attack of chest pain lasting longer than usual, despite rest and treatment with the medications your caregiver has prescribed  You awaken from sleep with chest pain or shortness of breath.  You feel faint or dizzy  You have chest pain not typical of your usual pain for which you originally saw your caregiver.   Amlodipine daily for your blood pressure.

## 2018-11-01 NOTE — ED Triage Notes (Signed)
Pt c/o center chest pain x 2 days with cough, pt reports BP at home has been running high, triage BP 169/110

## 2018-11-01 NOTE — ED Provider Notes (Signed)
[facility] EMERGENCY DEPARTMENT Provider Note   CSN: [id number] Arrival date & time: 11/01/18  1926    History   Chief Complaint Chief Complaint  Patient presents with  . Chest Pain    HPI Aaron Coffey is a 43 y.o. male.     HPI  43 y/o male, here with mother who he lives with - states that he has had CP for the last week intemrittently - nothing makes it come on or off and lasts for several seconds, no SOB, no f/c/n/v or diarrhea - occasional cough - mother is well.  Sx are persistent over the last week - wants to be tested for covid as well - no fever.  No hx of CAD - has had several HTN measurements this last month including today - 180 / 90 in the pharmacy.  Past Medical History:  Diagnosis Date  . Dystonia   . Tremor     Patient Active Problem List   Diagnosis Date Noted  . Appendicitis 12/19/2017  . Intentional self-harm by knife ([place])   . Adjustment disorder with mixed disturbance of emotions and conduct     Past Surgical History:  Procedure Laterality Date  . CHOLECYSTECTOMY    . LAPAROSCOPIC APPENDECTOMY N/A 12/19/2017   Procedure: APPENDECTOMY LAPAROSCOPIC;  Surgeon: Aviva Signs, MD;  Location: [facility];  Service: General;  Laterality: N/A;        Home Medications    Prior to Admission medications   Medication Sig Start Date End Date Taking? Authorizing Provider  amLODipine (NORVASC) 5 MG tablet Take 1 tablet (5 mg total) by mouth daily. 11/01/18   Noemi Chapel, MD  clotrimazole (LOTRIMIN) 1 % cream Apply to affected area 2 times daily for the next 7 days Patient not taking: Reported on 12/19/2017 08/31/17   Francine Graven, DO  metFORMIN (GLUCOPHAGE) 500 MG tablet Take 500 mg by mouth 2 (two) times daily with a meal.    [provider]  Omega-3 Fatty Acids (FISH OIL PO) Take 1 capsule by mouth daily.    [provider]  primidone (MYSOLINE) 50 MG tablet Take 100 mg by mouth 2 (two) times daily. 01/10/14   [provider]   simvastatin (ZOCOR) 20 MG tablet Take 20 mg by mouth every evening. 01/10/14   [provider]  trihexyphenidyl (ARTANE) 2 MG tablet Take 1.5 tablets by mouth daily. 10/12/17   [provider]    Family History History reviewed. No pertinent family history.  Social History Social History   Tobacco Use  . Smoking status: Never Smoker  . Smokeless tobacco: Never Used  Substance Use Topics  . Alcohol use: Yes    Comment: occ  . Drug use: No     Allergies   Patient has no known allergies.   Review of Systems Review of Systems  All other systems reviewed and are negative.    Physical Exam Updated Vital Signs BP (!) 164/110 (BP Location: Right Arm)   Pulse 77   Temp 98.7 F (37.1 C) (Oral)   Resp 16   SpO2 96%   Physical Exam Vitals signs and nursing note reviewed.  Constitutional:      General: He is not in acute distress.    Appearance: He is well-developed.  HENT:     Head: Normocephalic and atraumatic.     Mouth/Throat:     Pharynx: No oropharyngeal exudate.  Eyes:     General: No scleral icterus.  Right eye: No discharge.        Left eye: No discharge.     Conjunctiva/sclera: Conjunctivae normal.     Pupils: Pupils are equal, round, and reactive to light.  Neck:     Musculoskeletal: Normal range of motion and neck supple.     Thyroid: No thyromegaly.     Vascular: No JVD.  Cardiovascular:     Rate and Rhythm: Normal rate and regular rhythm.     Heart sounds: Normal heart sounds. No murmur. No friction rub. No gallop.   Pulmonary:     Effort: Pulmonary effort is normal. No respiratory distress.     Breath sounds: Normal breath sounds. No wheezing or rales.  Chest:     Chest wall: No tenderness.  Abdominal:     General: Bowel sounds are normal. There is no distension.     Palpations: Abdomen is soft. There is no mass.     Tenderness: There is no abdominal tenderness.  Musculoskeletal: Normal range of motion.        General:  No tenderness.  Lymphadenopathy:     Cervical: No cervical adenopathy.  Skin:    General: Skin is warm and dry.     Findings: No erythema or rash.  Neurological:     Mental Status: He is alert.     Coordination: Coordination normal.  Psychiatric:        Behavior: Behavior normal.      ED Treatments / Results  Labs (all labs ordered are listed, but only abnormal results are displayed) Labs Reviewed  BASIC METABOLIC PANEL - Abnormal; Notable for the following components:      Result Value   Potassium 3.2 (*)    Glucose, Bld 132 (*)    All other components within normal limits  CBC  TROPONIN I (HIGH SENSITIVITY)  TROPONIN I (HIGH SENSITIVITY)    EKG None  ED ECG REPORT  I personally interpreted this EKG   Date: 11/01/2018   Rate: 86  Rhythm: normal sinus rhythm  QRS Axis: normal  Intervals: normal  ST/T Wave abnormalities: nonspecific T wave changes  Conduction Disutrbances:none  Narrative Interpretation:   Old EKG Reviewed: none available   Radiology Dg Chest 2 View  Result Date: 11/01/2018 CLINICAL DATA:  Central chest pain, cough EXAM: CHEST - 2 VIEW COMPARISON:  None. FINDINGS: Heart and mediastinal contours are within normal limits. No focal opacities or effusions. No acute bony abnormality. IMPRESSION: No active cardiopulmonary disease. Electronically Signed   By: Charlett NoseKevin  Dover M.D.   On: 11/01/2018 20:16    Procedures Procedures (including critical care time)  Medications Ordered in ED Medications  amLODipine (NORVASC) tablet 5 mg (has no administration in time range)     Initial Impression / Assessment and Plan / ED Course  I have reviewed the triage vital signs and the nursing notes.  Pertinent labs & imaging results that were available during my care of the patient were reviewed by me and considered in my medical decision making (see chart for details).        EKG unremarkable - doesn't sound like cardiac pain  CXR negative for PTX / PNA or  other findings Htn is present - likely needs meds - has not been on OTC meds other than occasional aleve.  They want covid testing - recommended outpt testing [facility] at [facility] site across street - they are agreeahble - no fever, no sob, no hypoxia and no tachyacrida and normal lung sounds.  Stable for d/c after labs.  Trop neg  Final Clinical Impressions(s) / ED Diagnoses   Final diagnoses:  Chest pain at rest  Essential hypertension    ED Discharge Orders         Ordered    amLODipine (NORVASC) 5 MG tablet  Daily     11/01/18 2218           Eber HongMiller, Ritchard Paragas, MD 11/01/18 2219

## 2018-11-01 NOTE — ED Notes (Signed)
Pt ambulatory to waiting room. Pt verbalized understanding of discharge instructions.   

## 2018-11-13 DIAGNOSIS — Z6831 Body mass index (BMI) 31.0-31.9, adult: Secondary | ICD-10-CM | POA: Diagnosis not present

## 2018-11-13 DIAGNOSIS — R079 Chest pain, unspecified: Secondary | ICD-10-CM | POA: Diagnosis not present

## 2018-11-13 DIAGNOSIS — I10 Essential (primary) hypertension: Secondary | ICD-10-CM | POA: Diagnosis not present

## 2018-11-13 DIAGNOSIS — E6609 Other obesity due to excess calories: Secondary | ICD-10-CM | POA: Diagnosis not present

## 2018-12-30 DIAGNOSIS — E119 Type 2 diabetes mellitus without complications: Secondary | ICD-10-CM | POA: Diagnosis not present

## 2018-12-30 DIAGNOSIS — Z23 Encounter for immunization: Secondary | ICD-10-CM | POA: Diagnosis not present

## 2018-12-30 DIAGNOSIS — R7309 Other abnormal glucose: Secondary | ICD-10-CM | POA: Diagnosis not present

## 2018-12-30 DIAGNOSIS — E7849 Other hyperlipidemia: Secondary | ICD-10-CM | POA: Diagnosis not present

## 2018-12-30 DIAGNOSIS — Z6832 Body mass index (BMI) 32.0-32.9, adult: Secondary | ICD-10-CM | POA: Diagnosis not present

## 2018-12-30 DIAGNOSIS — Z1389 Encounter for screening for other disorder: Secondary | ICD-10-CM | POA: Diagnosis not present

## 2018-12-30 DIAGNOSIS — Z0000 Encounter for general adult medical examination without abnormal findings: Secondary | ICD-10-CM | POA: Diagnosis not present

## 2018-12-30 DIAGNOSIS — Z0001 Encounter for general adult medical examination with abnormal findings: Secondary | ICD-10-CM | POA: Diagnosis not present

## 2018-12-30 DIAGNOSIS — I10 Essential (primary) hypertension: Secondary | ICD-10-CM | POA: Diagnosis not present

## 2019-01-12 DIAGNOSIS — G4730 Sleep apnea, unspecified: Secondary | ICD-10-CM | POA: Diagnosis not present

## 2019-01-19 ENCOUNTER — Encounter: Payer: Self-pay | Admitting: Cardiology

## 2019-01-19 ENCOUNTER — Other Ambulatory Visit: Payer: Self-pay

## 2019-01-19 ENCOUNTER — Ambulatory Visit (INDEPENDENT_AMBULATORY_CARE_PROVIDER_SITE_OTHER): Payer: Medicare Other | Admitting: Cardiology

## 2019-01-19 VITALS — BP 149/90 | HR 92 | Temp 97.2°F | Ht 66.0 in | Wt 223.0 lb

## 2019-01-19 DIAGNOSIS — I10 Essential (primary) hypertension: Secondary | ICD-10-CM | POA: Diagnosis not present

## 2019-01-19 DIAGNOSIS — R0789 Other chest pain: Secondary | ICD-10-CM

## 2019-01-19 DIAGNOSIS — E782 Mixed hyperlipidemia: Secondary | ICD-10-CM

## 2019-01-19 DIAGNOSIS — Z9189 Other specified personal risk factors, not elsewhere classified: Secondary | ICD-10-CM | POA: Diagnosis not present

## 2019-01-19 NOTE — Patient Instructions (Signed)
Medication Instructions:  Your physician recommends that you continue on your current medications as directed. Please refer to the Current Medication list given to you today.  *If you need a refill on your cardiac medications before your next appointment, please call your pharmacy*  Lab Work: None today If you have labs (blood work) drawn today and your tests are completely normal, you will receive your results only by: Marland Kitchen MyChart Message (if you have MyChart) OR . A paper copy in the mail If you have any lab test that is abnormal or we need to change your treatment, we will call you to review the results.  Testing/Procedures: Your physician has requested that you have an echocardiogram. Echocardiography is a painless test that uses sound waves to create images of your heart. It provides your doctor with information about the size and shape of your heart and how well your heart's chambers and valves are working. This procedure takes approximately one hour. There are no restrictions for this procedure.    Follow-Up: At [facility], you and your health needs are our priority.  As part of our continuing mission to provide you with exceptional heart care, we have created designated Provider Care Teams.  These Care Teams include your primary Cardiologist (physician) and Advanced Practice Providers (APPs -  Physician Assistants and Nurse Practitioners) who all work together to provide you with the care you need, when you need it.  Your next appointment:     We will call you with results.

## 2019-01-19 NOTE — Progress Notes (Signed)
Cardiology Office Note  Date: 01/19/2019   ID: Aaron Coffey, DOB 08-Mar-1976, MRN [id number]  PCP:  Elfredia Nevins, MD  Consulting Cardiologist: Jonelle Sidle, MD Electrophysiologist:  None   Chief Complaint  Patient presents with  . History of chest pain    History of Present Illness: Aaron Coffey is a 43 y.o. male referred for cardiology consultation by Dr. Sherwood Gambler for the evaluation of chest pain.  He is here today with a family member.  Records indicate ER visit back in August with chest discomfort.  He was evaluated by Dr. Hyacinth Meeker at that time.  It was indicated that symptoms were not felt to be cardiac in etiology.  High-sensitivity troponin I level was not suggestive of ACS and his ECG showed no acute changes.  Patient and family member indicate that he has not experienced any subsequent chest pain after his ER evaluation, has been tolerating Norvasc for treatment of elevated blood pressure.  He does not report any palpitations or syncope.  He did just complete a sleep study, results pending.  He follows with a neurologist in [place] with history of dystonic tremor.  Past Medical History:  Diagnosis Date  . Dystonia   . Essential hypertension   . Tremor     Past Surgical History:  Procedure Laterality Date  . CHOLECYSTECTOMY    . LAPAROSCOPIC APPENDECTOMY N/A 12/19/2017   Procedure: APPENDECTOMY LAPAROSCOPIC;  Surgeon: Franky Macho, MD;  Location: [facility];  Service: General;  Laterality: N/A;    Current Outpatient Medications  Medication Sig Dispense Refill  . amLODipine (NORVASC) 5 MG tablet Take 1 tablet (5 mg total) by mouth daily. 30 tablet 0  . clotrimazole (LOTRIMIN) 1 % cream Apply to affected area 2 times daily for the next 7 days 15 g 0  . metFORMIN (GLUCOPHAGE) 500 MG tablet Take 500 mg by mouth 2 (two) times daily with a meal.    . Omega-3 Fatty Acids (FISH OIL PO) Take 1 capsule by mouth daily.    . primidone (MYSOLINE) 50 MG tablet  Take 100 mg by mouth 2 (two) times daily.    . simvastatin (ZOCOR) 20 MG tablet Take 20 mg by mouth every evening.    . trihexyphenidyl (ARTANE) 2 MG tablet Take 1.5 tablets by mouth daily.  0   No current facility-administered medications for this visit.    Allergies:  Patient has no known allergies.   Social History: The patient  reports that he has never smoked. He has never used smokeless tobacco. He reports current alcohol use. He reports that he does not use drugs.   Family History: The patient's family history includes Hypertension in his mother.   ROS:  Please see the history of present illness. Otherwise, complete review of systems is positive for none.  All other systems are reviewed and negative.   Physical Exam: VS:  BP (!) 149/90   Pulse 92   Temp (!) 97.2 F (36.2 C)   Ht 5\' 6"  (1.676 m)   Wt 223 lb (101.2 kg)   SpO2 98%   BMI 35.99 kg/m , BMI Body mass index is 35.99 kg/m.  Wt Readings from Last 3 Encounters:  01/19/19 223 lb (101.2 kg)  12/30/17 218 lb (98.9 kg)  12/19/17 221 lb 5.5 oz (100.4 kg)    General: Patient appears comfortable at rest. HEENT: Conjunctiva and lids normal, wearing a mask. Neck: Supple, no elevated JVP or carotid bruits, no thyromegaly. Lungs: Clear to auscultation,  nonlabored breathing at rest. Cardiac: Regular rate and rhythm, no S3 or significant systolic murmur, no pericardial rub. Abdomen: Obese, nontender, bowel sounds present. Extremities: No pitting edema, distal pulses 2+. Skin: Warm and dry. Musculoskeletal: No kyphosis. Neuropsychiatric: Alert and oriented x3, affect grossly appropriate.  ECG:  An ECG dated 11/01/2018 was personally reviewed today and demonstrated:  Normal sinus rhythm with nonspecific T wave changes.  Recent Labwork: 11/01/2018: BUN 10; Creatinine, Ser 0.83; Hemoglobin 15.5; Platelets 265; Potassium 3.2; Sodium 141   Other Studies Reviewed Today:  Chest x-ray 11/01/2018: FINDINGS: Heart and mediastinal  contours are within normal limits. No focal opacities or effusions. No acute bony abnormality.  IMPRESSION: No active cardiopulmonary disease.  Assessment and Plan:  1.  History of atypical chest pain, presently resolved.  I reviewed his ECG and lab work from [place] visit in August.  In light of those prior symptoms and hypertension now on Norvasc, we will obtain an echocardiogram to assess cardiac structure and function, mainly to exclude cardiomyopathy or wall motion abnormalities.  In addition, if he does have obstructive sleep apnea, treatment for that may also help control his blood pressure.  No medication changes made at this time.  We will inform him of test results.  2.  At risk for obstructive sleep apnea.  He just completed outpatient sleep testing with results pending and follow-up by PCP.  3.  Obesity, weight loss would also be beneficial in terms of blood pressure control.  4.  Mixed hyperlipidemia, on Zocor with follow-up by PCP.  Medication Adjustments/Labs and Tests Ordered: Current medicines are reviewed at length with the patient today.  Concerns regarding medicines are outlined above.   Tests Ordered: Orders Placed This Encounter  Procedures  . ECHOCARDIOGRAM COMPLETE    Medication Changes: No orders of the defined types were placed in this encounter.   Disposition:  Follow up test results.  Signed, Satira Sark, MD, [facility] 01/19/2019 1:36 PM    [name] [address], [place], [place] [address] Phone: [phone]; Fax: [phone]

## 2019-01-28 ENCOUNTER — Ambulatory Visit (HOSPITAL_COMMUNITY)
Admission: RE | Admit: 2019-01-28 | Discharge: 2019-01-28 | Disposition: A | Discharge status: Home / Self Care | Payer: Medicare Other | Source: Ambulatory Visit | Attending: Cardiology | Admitting: Cardiology

## 2019-01-28 ENCOUNTER — Other Ambulatory Visit: Payer: Self-pay

## 2019-01-28 DIAGNOSIS — R0789 Other chest pain: Secondary | ICD-10-CM | POA: Diagnosis not present

## 2019-01-28 NOTE — Progress Notes (Signed)
*  PRELIMINARY RESULTS* Echocardiogram 2D Echocardiogram has been performed.  Aaron Coffey 01/28/2019, 3:27 PM

## 2019-02-02 DIAGNOSIS — G4733 Obstructive sleep apnea (adult) (pediatric): Secondary | ICD-10-CM | POA: Diagnosis not present

## 2019-04-01 DIAGNOSIS — I10 Essential (primary) hypertension: Secondary | ICD-10-CM | POA: Diagnosis not present

## 2019-04-01 DIAGNOSIS — E119 Type 2 diabetes mellitus without complications: Secondary | ICD-10-CM | POA: Diagnosis not present

## 2019-04-01 DIAGNOSIS — E781 Pure hyperglyceridemia: Secondary | ICD-10-CM | POA: Diagnosis not present

## 2019-04-16 DIAGNOSIS — Z79899 Other long term (current) drug therapy: Secondary | ICD-10-CM | POA: Diagnosis not present

## 2019-04-16 DIAGNOSIS — R251 Tremor, unspecified: Secondary | ICD-10-CM | POA: Diagnosis not present

## 2019-04-16 DIAGNOSIS — G249 Dystonia, unspecified: Secondary | ICD-10-CM | POA: Diagnosis not present

## 2019-04-16 DIAGNOSIS — G248 Other dystonia: Secondary | ICD-10-CM | POA: Diagnosis not present

## 2019-04-16 DIAGNOSIS — G252 Other specified forms of tremor: Secondary | ICD-10-CM | POA: Diagnosis not present

## 2019-06-19 ENCOUNTER — Ambulatory Visit: Payer: Medicare Other | Attending: Internal Medicine

## 2019-06-19 DIAGNOSIS — Z23 Encounter for immunization: Secondary | ICD-10-CM

## 2019-06-19 NOTE — Progress Notes (Signed)
   Covid-19 Vaccination Clinic  Name:  DEJOHN IBARRA    MRN: [id number] DOB: 1975/11/24  06/19/2019  Mr. Kau was observed post Covid-19 immunization for 15 minutes without incident. He was provided with Vaccine Information Sheet and instruction to access the V-Safe system.   Mr. Niblack was instructed to call 911 with any severe reactions post vaccine: Marland Kitchen Difficulty breathing  . Swelling of face and throat  . A fast heartbeat  . A bad rash all over body  . Dizziness and weakness   Immunizations Administered    Name Date Dose VIS Date Route   Moderna COVID-19 Vaccine 06/19/2019  9:54 AM 0.5 mL 03/02/2019 Intramuscular   Manufacturer: Moderna   Lot: [id number]   NDC: 43329-518-84

## 2019-07-02 DIAGNOSIS — G248 Other dystonia: Secondary | ICD-10-CM | POA: Diagnosis not present

## 2019-07-02 DIAGNOSIS — R251 Tremor, unspecified: Secondary | ICD-10-CM | POA: Diagnosis not present

## 2019-07-20 ENCOUNTER — Ambulatory Visit: Payer: Medicare Other | Attending: Internal Medicine

## 2019-07-20 DIAGNOSIS — Z23 Encounter for immunization: Secondary | ICD-10-CM

## 2019-07-20 NOTE — Progress Notes (Signed)
   Covid-19 Vaccination Clinic  Name:  EATON FOLMAR    MRN: [id number] DOB: 08-25-1975  07/20/2019  Mr. Fahrner was observed post Covid-19 immunization for 15 minutes without incident. He was provided with Vaccine Information Sheet and instruction to access the V-Safe system.   Mr. Schicker was instructed to call 911 with any severe reactions post vaccine: Marland Kitchen Difficulty breathing  . Swelling of face and throat  . A fast heartbeat  . A bad rash all over body  . Dizziness and weakness   Immunizations Administered    Name Date Dose VIS Date Route   Moderna COVID-19 Vaccine 07/20/2019 10:54 AM 0.5 mL 03/2019 Intramuscular   Manufacturer: Moderna   Lot: [id number]   NDC: 20037-944-46

## 2019-07-21 ENCOUNTER — Ambulatory Visit: Payer: Self-pay

## 2019-07-27 ENCOUNTER — Other Ambulatory Visit: Payer: Self-pay

## 2019-07-27 ENCOUNTER — Emergency Department (HOSPITAL_COMMUNITY)
Admission: EM | Admit: 2019-07-27 | Discharge: 2019-07-28 | Disposition: A | Discharge status: Home / Self Care | Payer: Medicare Other | Attending: Emergency Medicine | Admitting: Emergency Medicine

## 2019-07-27 ENCOUNTER — Encounter (HOSPITAL_COMMUNITY): Payer: Self-pay | Admitting: *Deleted

## 2019-07-27 DIAGNOSIS — E119 Type 2 diabetes mellitus without complications: Secondary | ICD-10-CM | POA: Insufficient documentation

## 2019-07-27 DIAGNOSIS — Z20822 Contact with and (suspected) exposure to covid-19: Secondary | ICD-10-CM | POA: Diagnosis not present

## 2019-07-27 DIAGNOSIS — R109 Unspecified abdominal pain: Secondary | ICD-10-CM | POA: Diagnosis not present

## 2019-07-27 DIAGNOSIS — R197 Diarrhea, unspecified: Secondary | ICD-10-CM | POA: Insufficient documentation

## 2019-07-27 DIAGNOSIS — I10 Essential (primary) hypertension: Secondary | ICD-10-CM | POA: Insufficient documentation

## 2019-07-27 DIAGNOSIS — Z79899 Other long term (current) drug therapy: Secondary | ICD-10-CM | POA: Diagnosis not present

## 2019-07-27 DIAGNOSIS — R1084 Generalized abdominal pain: Secondary | ICD-10-CM | POA: Diagnosis not present

## 2019-07-27 LAB — COMPREHENSIVE METABOLIC PANEL
ALT: 27 U/L (ref 0–44)
AST: 17 U/L (ref 15–41)
Albumin: 4.2 g/dL (ref 3.5–5.0)
Alkaline Phosphatase: 72 U/L (ref 38–126)
Anion gap: 10 (ref 5–15)
BUN: 13 mg/dL (ref 6–20)
CO2: 22 mmol/L (ref 22–32)
Calcium: 8.8 mg/dL — ABNORMAL LOW (ref 8.9–10.3)
Chloride: 100 mmol/L (ref 98–111)
Creatinine, Ser: 0.91 mg/dL (ref 0.61–1.24)
GFR calc Af Amer: >60 mL/min (ref >60)
GFR calc non Af Amer: >60 mL/min (ref >60)
Glucose, Bld: 217 mg/dL — ABNORMAL HIGH (ref 70–99)
Potassium: 3.5 mmol/L (ref 3.5–5.1)
Sodium: 132 mmol/L — ABNORMAL LOW (ref 135–145)
Total Bilirubin: 0.8 mg/dL (ref 0.3–1.2)
Total Protein: 7.6 g/dL (ref 6.5–8.1)

## 2019-07-27 LAB — CBC
HCT: 48.3 % (ref 39.0–52.0)
Hemoglobin: 16.6 g/dL (ref 13.0–17.0)
MCH: 28.1 pg (ref 26.0–34.0)
MCHC: 34.4 g/dL (ref 30.0–36.0)
MCV: 81.7 fL (ref 80.0–100.0)
Platelets: 306 10*3/uL (ref 150–400)
RBC: 5.91 MIL/uL — ABNORMAL HIGH (ref 4.22–5.81)
RDW: 12.9 % (ref 11.5–15.5)
WBC: 9 10*3/uL (ref 4.0–10.5)
nRBC: 0 % (ref 0.0–0.2)

## 2019-07-27 LAB — LIPASE, BLOOD: Lipase: 25 U/L (ref 11–51)

## 2019-07-27 MED ORDER — SODIUM CHLORIDE 0.9% FLUSH: 3.0000 mL | Freq: Once | INTRAVENOUS | Status: DC

## 2019-07-27 NOTE — ED Triage Notes (Signed)
Pt with abd pain since 1600 with diarrhea

## 2019-07-28 ENCOUNTER — Emergency Department (HOSPITAL_COMMUNITY): Payer: Medicare Other

## 2019-07-28 DIAGNOSIS — R197 Diarrhea, unspecified: Secondary | ICD-10-CM | POA: Diagnosis not present

## 2019-07-28 DIAGNOSIS — Z20822 Contact with and (suspected) exposure to covid-19: Secondary | ICD-10-CM | POA: Diagnosis not present

## 2019-07-28 DIAGNOSIS — R1084 Generalized abdominal pain: Secondary | ICD-10-CM | POA: Diagnosis not present

## 2019-07-28 DIAGNOSIS — R109 Unspecified abdominal pain: Secondary | ICD-10-CM | POA: Diagnosis not present

## 2019-07-28 LAB — URINALYSIS, ROUTINE W REFLEX MICROSCOPIC
Bilirubin Urine: NEGATIVE
Glucose, UA: NEGATIVE mg/dL
Hgb urine dipstick: NEGATIVE
Ketones, ur: NEGATIVE mg/dL
Leukocytes,Ua: NEGATIVE
Nitrite: NEGATIVE
Protein, ur: NEGATIVE mg/dL
Specific Gravity, Urine: 1.041 — ABNORMAL HIGH (ref 1.005–1.030)
pH: 5 (ref 5.0–8.0)

## 2019-07-28 LAB — RESPIRATORY PANEL BY RT PCR (FLU A&B, COVID)
Influenza A by PCR: NEGATIVE
Influenza B by PCR: NEGATIVE
SARS Coronavirus 2 by RT PCR: NEGATIVE

## 2019-07-28 MED ORDER — ACETAMINOPHEN 325 MG PO TABS
650.0000 mg | ORAL_TABLET | Freq: Once | ORAL | Status: AC
  Administered 2019-07-28: 650 mg via ORAL
  Filled 2019-07-28: qty 2

## 2019-07-28 MED ORDER — ONDANSETRON HCL 4 MG PO TABS
4.0000 mg | ORAL_TABLET | Freq: Three times a day (TID) | ORAL | 0 refills | Status: DC | PRN
Start: 2019-07-28 — End: 2022-04-16

## 2019-07-28 MED ORDER — IOHEXOL 300 MG/ML  SOLN
100.0000 mL | Freq: Once | INTRAMUSCULAR | Status: AC | PRN
  Administered 2019-07-28: 100 mL via INTRAVENOUS

## 2019-07-28 NOTE — ED Provider Notes (Signed)
[facility] EMERGENCY DEPARTMENT Provider Note   CSN: [id number] Arrival date & time: 07/27/19  2019     History Chief Complaint  Patient Coffey with  . Abdominal Pain    Aaron Coffey is a 44 y.o. male.  HPI      Aaron Coffey is a 44 y.o. male with past medical history of type II Coffey, Aaron Coffey, Aaron Coffey to the Emergency Department complaining of generalized abdominal pain, nausea, Aaron 2 episodes of diarrhea.  Symptoms began yesterday after increasing his Metformin dose from once daily to twice daily.  Patient was [place] at the beach over the weekend Aaron yesterday, he notes having a large meal Aaron developed diffuse abdominal pain.  Pain gradually resolved after 1 episode of diarrhea.  He had another episode of diarrhea this afternoon after taking his evening dose of Metformin.  He also complained of generalized body aches Aaron malaise.  Some associated nausea without vomiting.  On arrival to the ER he is noted to have a fever.  He has not taken any fever reducers today.  He states that he received his second Covid vaccine 6 days ago.   Past Medical History:  Diagnosis Date  . Dystonia   . Essential Aaron Coffey   . Tremor     Patient Active Problem List   Diagnosis Date Noted  . Appendicitis 12/19/2017  . Intentional self-harm by knife (HCC)   . Adjustment disorder with mixed disturbance of emotions Aaron conduct     Past Surgical History:  Procedure Laterality Date  . CHOLECYSTECTOMY    . LAPAROSCOPIC APPENDECTOMY N/A 12/19/2017   Procedure: APPENDECTOMY LAPAROSCOPIC;  Surgeon: Franky Macho, MD;  Location: [facility];  Service: General;  Laterality: N/A;       Family History  Problem Relation Age of Onset  . Aaron Coffey Mother     Social History   Tobacco Use  . Smoking status: Never Smoker  . Smokeless tobacco: Never Used  Substance Use Topics  . Alcohol use: Yes    Comment: Occasional  . Drug use: No    Home  Medications Prior to Admission medications   Medication Sig Start Date End Date Taking? Authorizing Provider  amLODipine (NORVASC) 5 MG tablet Take 1 tablet (5 mg total) by mouth daily. 11/01/18   Eber Hong, MD  clotrimazole (LOTRIMIN) 1 % cream Apply to affected area 2 times daily for the next 7 days 08/31/17   Samuel Jester, DO  metFORMIN (GLUCOPHAGE-XR) 500 MG 24 hr tablet Take 500 mg by mouth 2 (two) times daily. 06/03/19   [provider]  Omega-3 Fatty Acids (FISH OIL PO) Take 1 capsule by mouth daily.    [provider]  primidone (MYSOLINE) 50 MG tablet Take 100 mg by mouth 2 (two) times daily. 01/10/14   [provider]  simvastatin (ZOCOR) 20 MG tablet Take 20 mg by mouth every evening. 01/10/14   [provider]  trihexyphenidyl (ARTANE) 2 MG tablet Take 1.5 tablets by mouth daily. 10/12/17   [provider]    Allergies    Patient has no known allergies.  Review of Systems   Review of Systems  Constitutional: Positive for appetite change Aaron fever. Negative for chills.  Respiratory: Negative for chest tightness Aaron shortness of breath.   Cardiovascular: Negative for chest pain.  Gastrointestinal: Positive for abdominal pain, diarrhea Aaron nausea. Negative for blood in stool Aaron vomiting.  Genitourinary: Negative for decreased urine volume, difficulty urinating, dysuria Aaron  flank pain.  Musculoskeletal: Positive for myalgias. Negative for back pain.  Skin: Negative for color change Aaron rash.  Neurological: Negative for dizziness, weakness Aaron numbness.  Hematological: Negative for adenopathy.    Physical Exam Updated Vital Signs BP (!) 136/98 (BP Location: Right Arm)   Pulse (!) 120   Temp (!) 102.1 F (38.9 C) (Oral)   Resp 18   Ht 5\' 6"  (1.676 m)   Wt 101.6 kg   SpO2 98%   BMI 36.15 kg/m   Physical Exam Vitals Aaron nursing note reviewed.  Constitutional:      General: He is not in acute distress.    Appearance: He  is well-developed. He is not ill-appearing.  HENT:     Mouth/Throat:     Mouth: Mucous membranes are moist.     Pharynx: No posterior oropharyngeal erythema.  Cardiovascular:     Rate Aaron Rhythm: Regular rhythm. Tachycardia present.     Pulses: Normal pulses.  Pulmonary:     Effort: Pulmonary effort is normal.     Breath sounds: Normal breath sounds.  Chest:     Chest wall: No tenderness.  Abdominal:     Palpations: Abdomen is soft.     Tenderness: There is abdominal tenderness in the left lower quadrant. There is no right CVA tenderness, left CVA tenderness or guarding.     Comments: Mild tenderness to palpation of the left mid to lower abdomen.  No guarding or rebound tenderness.  abd is soft   Musculoskeletal:        General: Normal range of motion.     Cervical back: Normal range of motion. No rigidity.     Left lower leg: No edema.  Lymphadenopathy:     Cervical: No cervical adenopathy.  Skin:    General: Skin is warm.     Capillary Refill: Capillary refill takes less than 2 seconds.     Findings: No rash.  Neurological:     General: No focal deficit present.     Mental Status: He is alert.     ED Results / Procedures / Treatments   Labs (all labs ordered are listed, but only abnormal results are displayed) Labs Reviewed  COMPREHENSIVE METABOLIC PANEL - Abnormal; Notable for the following components:      Result Value   Sodium 132 (*)    Glucose, Bld 217 (*)    Calcium 8.8 (*)    All other components within normal limits  CBC - Abnormal; Notable for the following components:   RBC 5.91 (*)    All other components within normal limits  RESPIRATORY PANEL BY RT PCR (FLU A&B, COVID)  LIPASE, BLOOD  URINALYSIS, ROUTINE W REFLEX MICROSCOPIC    EKG None  Radiology CT ABDOMEN PELVIS W CONTRAST  Result Date: 07/28/2019 CLINICAL DATA:  Abdominal pain Aaron diarrhea EXAM: CT ABDOMEN Aaron PELVIS WITH CONTRAST TECHNIQUE: Multidetector CT imaging of the abdomen Aaron pelvis  was performed using the standard protocol following bolus administration of intravenous contrast. CONTRAST:  16mL OMNIPAQUE IOHEXOL 300 MG/ML  SOLN COMPARISON:  12/18/2017 FINDINGS: LOWER CHEST: Normal. HEPATOBILIARY: Normal hepatic contours. No intra- or extrahepatic biliary dilatation. The gallbladder is normal. PANCREAS: Normal pancreas. No ductal dilatation or peripancreatic fluid collection. SPLEEN: Normal. ADRENALS/URINARY TRACT: The adrenal glands are normal. No hydronephrosis, nephroureterolithiasis or solid renal mass. The urinary bladder is normal for degree of distention STOMACH/BOWEL: There is no hiatal hernia. Normal duodenal course Aaron caliber. No small bowel dilatation or inflammation. No focal  colonic abnormality. Status post appendectomy. VASCULAR/LYMPHATIC: Normal course Aaron caliber of the major abdominal vessels. No abdominal or pelvic lymphadenopathy. REPRODUCTIVE: Normal prostate size with symmetric seminal vesicles. MUSCULOSKELETAL. No bony spinal canal stenosis or focal osseous abnormality. OTHER: None. IMPRESSION: No acute abnormality of the abdomen or pelvis. Electronically Signed   By: Deatra Robinson M.D.   On: 07/28/2019 01:40    Procedures Procedures (including critical care time)  Medications Ordered in ED Medications  sodium chloride flush (NS) 0.9 % injection 3 mL (has no administration in time range)  acetaminophen (TYLENOL) tablet 650 mg (650 mg Oral Given 07/28/19 0028)    ED Course  I have reviewed the triage vital signs Aaron the nursing notes.  Pertinent labs & imaging results that were available during my care of the patient were reviewed by me Aaron considered in my medical decision making (see chart for details).    MDM Rules/Calculators/A&P                      Pt here with abdominal pain, nausea Aaron diarrhea.  Received second covid vaccine less than 10 days ago.  He was been vacationing at [employer] when his symptoms began.  Recently increased his metformin  dose to 500 mg twice daily. Pt is noted to be febrile Aaron tachycardic here.  Tylenol given.  He has some left mid to lower abdominal tenderness on my exam.  No peritoneal signs, hypotension Aaron he does not appear septic.     0145  Laboratory studies are reassuring.  Covid test is negative. Ct abd/pelvis is negative for acute process.  Urine results pending. Vitals signs improved.   Pt signed out to Dr. Devoria Albe who agrees to review CT results Aaron arrange dispo  Final Clinical Impression(s) / ED Diagnoses Final diagnoses:  None    Rx / DC Orders ED Discharge Orders    None       Pauline Aus, PA-C 07/28/19 0153    Devoria Albe, MD 07/28/19 [phone]

## 2019-07-28 NOTE — Discharge Instructions (Addendum)
Bland diet as tolerated.  Take Imodium over-the-counter for diarrhea.  Use the Zofran for nausea.  Take acetaminophen 1000 mg every 6 hours as needed for fever.  Continue to take your Metformin as directed.  Follow-up with Dr. Sharyon Medicus office for recheck.  Return to the emergency department if you get dehydrated.

## 2019-07-30 DIAGNOSIS — G250 Essential tremor: Secondary | ICD-10-CM | POA: Diagnosis not present

## 2019-07-30 DIAGNOSIS — E7849 Other hyperlipidemia: Secondary | ICD-10-CM | POA: Diagnosis not present

## 2019-07-30 DIAGNOSIS — I10 Essential (primary) hypertension: Secondary | ICD-10-CM | POA: Diagnosis not present

## 2019-07-30 DIAGNOSIS — E119 Type 2 diabetes mellitus without complications: Secondary | ICD-10-CM | POA: Diagnosis not present

## 2019-08-30 DIAGNOSIS — G250 Essential tremor: Secondary | ICD-10-CM | POA: Diagnosis not present

## 2019-08-30 DIAGNOSIS — I10 Essential (primary) hypertension: Secondary | ICD-10-CM | POA: Diagnosis not present

## 2019-08-30 DIAGNOSIS — E7849 Other hyperlipidemia: Secondary | ICD-10-CM | POA: Diagnosis not present

## 2019-08-30 DIAGNOSIS — E119 Type 2 diabetes mellitus without complications: Secondary | ICD-10-CM | POA: Diagnosis not present

## 2019-10-06 DIAGNOSIS — G248 Other dystonia: Secondary | ICD-10-CM | POA: Diagnosis not present

## 2019-10-06 DIAGNOSIS — Z79899 Other long term (current) drug therapy: Secondary | ICD-10-CM | POA: Diagnosis not present

## 2019-10-06 DIAGNOSIS — G252 Other specified forms of tremor: Secondary | ICD-10-CM | POA: Diagnosis not present

## 2019-12-30 DIAGNOSIS — E781 Pure hyperglyceridemia: Secondary | ICD-10-CM | POA: Diagnosis not present

## 2019-12-30 DIAGNOSIS — E119 Type 2 diabetes mellitus without complications: Secondary | ICD-10-CM | POA: Diagnosis not present

## 2019-12-30 DIAGNOSIS — I10 Essential (primary) hypertension: Secondary | ICD-10-CM | POA: Diagnosis not present

## 2020-01-14 DIAGNOSIS — G248 Other dystonia: Secondary | ICD-10-CM | POA: Diagnosis not present

## 2020-01-14 DIAGNOSIS — G249 Dystonia, unspecified: Secondary | ICD-10-CM | POA: Diagnosis not present

## 2020-01-14 DIAGNOSIS — G252 Other specified forms of tremor: Secondary | ICD-10-CM | POA: Diagnosis not present

## 2020-01-17 DIAGNOSIS — G250 Essential tremor: Secondary | ICD-10-CM | POA: Diagnosis not present

## 2020-01-17 DIAGNOSIS — F70 Mild intellectual disabilities: Secondary | ICD-10-CM | POA: Diagnosis not present

## 2020-01-17 DIAGNOSIS — Z0001 Encounter for general adult medical examination with abnormal findings: Secondary | ICD-10-CM | POA: Diagnosis not present

## 2020-01-17 DIAGNOSIS — E7849 Other hyperlipidemia: Secondary | ICD-10-CM | POA: Diagnosis not present

## 2020-01-17 DIAGNOSIS — Z23 Encounter for immunization: Secondary | ICD-10-CM | POA: Diagnosis not present

## 2020-01-17 DIAGNOSIS — Z0000 Encounter for general adult medical examination without abnormal findings: Secondary | ICD-10-CM | POA: Diagnosis not present

## 2020-01-17 DIAGNOSIS — Z1389 Encounter for screening for other disorder: Secondary | ICD-10-CM | POA: Diagnosis not present

## 2020-01-17 DIAGNOSIS — F909 Attention-deficit hyperactivity disorder, unspecified type: Secondary | ICD-10-CM | POA: Diagnosis not present

## 2020-01-17 DIAGNOSIS — E119 Type 2 diabetes mellitus without complications: Secondary | ICD-10-CM | POA: Diagnosis not present

## 2020-01-17 DIAGNOSIS — E782 Mixed hyperlipidemia: Secondary | ICD-10-CM | POA: Diagnosis not present

## 2020-01-17 DIAGNOSIS — Z1331 Encounter for screening for depression: Secondary | ICD-10-CM | POA: Diagnosis not present

## 2020-01-17 DIAGNOSIS — Z6832 Body mass index (BMI) 32.0-32.9, adult: Secondary | ICD-10-CM | POA: Diagnosis not present

## 2020-02-29 DIAGNOSIS — E119 Type 2 diabetes mellitus without complications: Secondary | ICD-10-CM | POA: Diagnosis not present

## 2020-02-29 DIAGNOSIS — I10 Essential (primary) hypertension: Secondary | ICD-10-CM | POA: Diagnosis not present

## 2020-02-29 DIAGNOSIS — E781 Pure hyperglyceridemia: Secondary | ICD-10-CM | POA: Diagnosis not present

## 2020-03-16 DIAGNOSIS — Z23 Encounter for immunization: Secondary | ICD-10-CM | POA: Diagnosis not present

## 2020-03-31 DIAGNOSIS — E781 Pure hyperglyceridemia: Secondary | ICD-10-CM | POA: Diagnosis not present

## 2020-03-31 DIAGNOSIS — I10 Essential (primary) hypertension: Secondary | ICD-10-CM | POA: Diagnosis not present

## 2020-03-31 DIAGNOSIS — E119 Type 2 diabetes mellitus without complications: Secondary | ICD-10-CM | POA: Diagnosis not present

## 2020-04-28 DIAGNOSIS — R251 Tremor, unspecified: Secondary | ICD-10-CM | POA: Diagnosis not present

## 2020-04-28 DIAGNOSIS — G252 Other specified forms of tremor: Secondary | ICD-10-CM | POA: Diagnosis not present

## 2020-04-28 DIAGNOSIS — G249 Dystonia, unspecified: Secondary | ICD-10-CM | POA: Diagnosis not present

## 2020-04-29 DIAGNOSIS — E119 Type 2 diabetes mellitus without complications: Secondary | ICD-10-CM | POA: Diagnosis not present

## 2020-04-29 DIAGNOSIS — E781 Pure hyperglyceridemia: Secondary | ICD-10-CM | POA: Diagnosis not present

## 2020-04-29 DIAGNOSIS — I10 Essential (primary) hypertension: Secondary | ICD-10-CM | POA: Diagnosis not present

## 2020-05-29 DIAGNOSIS — E119 Type 2 diabetes mellitus without complications: Secondary | ICD-10-CM | POA: Diagnosis not present

## 2020-05-29 DIAGNOSIS — I10 Essential (primary) hypertension: Secondary | ICD-10-CM | POA: Diagnosis not present

## 2020-05-29 DIAGNOSIS — E781 Pure hyperglyceridemia: Secondary | ICD-10-CM | POA: Diagnosis not present

## 2020-06-02 ENCOUNTER — Encounter: Payer: Self-pay | Admitting: Emergency Medicine

## 2020-06-02 ENCOUNTER — Other Ambulatory Visit: Payer: Self-pay

## 2020-06-02 ENCOUNTER — Ambulatory Visit
Admission: EM | Admit: 2020-06-02 | Discharge: 2020-06-02 | Disposition: A | Discharge status: Home / Self Care | Payer: Medicare Other | Attending: Emergency Medicine | Admitting: Emergency Medicine

## 2020-06-02 DIAGNOSIS — R059 Cough, unspecified: Secondary | ICD-10-CM

## 2020-06-02 DIAGNOSIS — R0981 Nasal congestion: Secondary | ICD-10-CM

## 2020-06-02 MED ORDER — BENZONATATE 100 MG PO CAPS
100.0000 mg | ORAL_CAPSULE | Freq: Three times a day (TID) | ORAL | 0 refills | Status: DC
Start: 2020-06-02 — End: 2021-03-15

## 2020-06-02 NOTE — ED Provider Notes (Signed)
[facility]   [id number] 06/02/20 Arrival Time: 1219   CC: COVID symptoms  SUBJECTIVE: History from: patient and family.  Aaron Coffey is a 45 y.o. male who presents with sinus, nasal congestion, and cough x 3 days.  Denies sick exposure to COVID, flu or strep.  Has tried OTC medication without relief.  Symptoms are made worse at night.  Reports previous symptoms in the past.   Denies fever, chills, fatigue, rhinorrhea, sore throat, SOB, wheezing, chest pain, nausea, changes in bowel or bladder habits.    ROS: As per HPI.  All other pertinent ROS negative.     Past Medical History:  Diagnosis Date  . Dystonia   . Essential hypertension   . Tremor    Past Surgical History:  Procedure Laterality Date  . CHOLECYSTECTOMY    . LAPAROSCOPIC APPENDECTOMY N/A 12/19/2017   Procedure: APPENDECTOMY LAPAROSCOPIC;  Surgeon: Franky Macho, MD;  Location: [facility];  Service: General;  Laterality: N/A;   No Known Allergies No current facility-administered medications on file prior to encounter.   Current Outpatient Medications on File Prior to Encounter  Medication Sig Dispense Refill  . amLODipine (NORVASC) 5 MG tablet Take 1 tablet (5 mg total) by mouth daily. 30 tablet 0  . clotrimazole (LOTRIMIN) 1 % cream Apply to affected area 2 times daily for the next 7 days 15 g 0  . metFORMIN (GLUCOPHAGE-XR) 500 MG 24 hr tablet Take 500 mg by mouth 2 (two) times daily.    . Omega-3 Fatty Acids (FISH OIL PO) Take 1 capsule by mouth daily.    . ondansetron (ZOFRAN) 4 MG tablet Take 1 tablet (4 mg total) by mouth every 8 (eight) hours as needed for nausea or vomiting. 10 tablet 0  . primidone (MYSOLINE) 50 MG tablet Take 100 mg by mouth 2 (two) times daily.    . simvastatin (ZOCOR) 20 MG tablet Take 20 mg by mouth every evening.    . trihexyphenidyl (ARTANE) 2 MG tablet Take 1.5 tablets by mouth daily.  0   Social History   Socioeconomic History  . Marital status: Single    Spouse  name: Not on file  . Number of children: Not on file  . Years of education: Not on file  . Highest education level: Not on file  Occupational History  . Not on file  Tobacco Use  . Smoking status: Never Smoker  . Smokeless tobacco: Never Used  Vaping Use  . Vaping Use: Never used  Substance and Sexual Activity  . Alcohol use: Yes    Comment: Occasional  . Drug use: No  . Sexual activity: Not on file  Other Topics Concern  . Not on file  Social History Narrative  . Not on file   Social Determinants of Health   Financial Resource Strain: Not on file  Food Insecurity: Not on file  Transportation Needs: Not on file  Physical Activity: Not on file  Stress: Not on file  Social Connections: Not on file  Intimate Partner Violence: Not on file   Family History  Problem Relation Age of Onset  . Hypertension Mother     OBJECTIVE:  Vitals:   06/02/20 1233  BP: (!) 144/97  Pulse: 95  Resp: 19  Temp: 98.1 F (36.7 C)  TempSrc: Oral  SpO2: 97%     General appearance: alert; appears fatigued, but nontoxic; speaking in full sentences and tolerating own secretions HEENT: NCAT; Ears: EACs clear, TMs pearly gray; Eyes: PERRL.  EOM grossly intact. Nose: nares patent without rhinorrhea, Throat: oropharynx clear, tonsils non erythematous or enlarged, uvula midline  Neck: supple without LAD Lungs: unlabored respirations, symmetrical air entry; cough: absent; no respiratory distress; CTAB Heart: regular rate and rhythm.  Skin: warm and dry Psychological: alert and cooperative; normal mood and affect  ASSESSMENT & PLAN:  1. Cough     Meds ordered this encounter  Medications  . benzonatate (TESSALON) 100 MG capsule    Sig: Take 1 capsule (100 mg total) by mouth every 8 (eight) hours.    Dispense:  21 capsule    Refill:  0    Order Specific Question:   Supervising Provider    Answer:   Eustace Moore [[id number]]   COVID testing ordered.  It will take between 5-7 days for  test results.  Someone will contact you regarding abnormal results.    In the meantime: You should remain isolated in your home for 10 days from symptom onset AND greater than 72 hours after symptoms resolution (absence of fever without the use of fever-reducing medication and improvement in respiratory symptoms), whichever is longer Get plenty of rest and push fluids Tessalon Perles prescribed for cough Use OTC zyrtec for nasal congestion, runny nose, and/or sore throat Use OTC flonase for nasal congestion and runny nose Use medications daily for symptom relief Use OTC medications like ibuprofen or tylenol as needed fever or pain Call or go to the ED if you have any new or worsening symptoms such as fever, worsening cough, shortness of breath, chest tightness, chest pain, turning blue, changes in mental status, etc...   Reviewed expectations re: course of current medical issues. Questions answered. Outlined signs and symptoms indicating need for more acute intervention. Patient verbalized understanding. After Visit Summary given.         Rennis Harding, PA-C 06/02/20 1258

## 2020-06-02 NOTE — Discharge Instructions (Signed)

## 2020-06-02 NOTE — ED Triage Notes (Addendum)
Sinus,nasal congestion and cough for past 3 days.  Taking otc sudafed d with no relief.

## 2020-06-03 LAB — COVID-19, FLU A+B NAA
Influenza A, NAA: NOT DETECTED
Influenza B, NAA: NOT DETECTED
SARS-CoV-2, NAA: NOT DETECTED

## 2020-06-28 DIAGNOSIS — E119 Type 2 diabetes mellitus without complications: Secondary | ICD-10-CM | POA: Diagnosis not present

## 2020-06-28 DIAGNOSIS — E781 Pure hyperglyceridemia: Secondary | ICD-10-CM | POA: Diagnosis not present

## 2020-06-28 DIAGNOSIS — I10 Essential (primary) hypertension: Secondary | ICD-10-CM | POA: Diagnosis not present

## 2020-07-14 DIAGNOSIS — G252 Other specified forms of tremor: Secondary | ICD-10-CM | POA: Diagnosis not present

## 2020-07-14 DIAGNOSIS — G2409 Other drug induced dystonia: Secondary | ICD-10-CM | POA: Diagnosis not present

## 2020-07-14 DIAGNOSIS — G2589 Other specified extrapyramidal and movement disorders: Secondary | ICD-10-CM | POA: Diagnosis not present

## 2020-09-13 ENCOUNTER — Other Ambulatory Visit: Payer: Self-pay

## 2020-09-13 ENCOUNTER — Ambulatory Visit (HOSPITAL_COMMUNITY)
Admission: RE | Admit: 2020-09-13 | Discharge: 2020-09-13 | Disposition: A | Discharge status: Home / Self Care | Payer: Medicare Other | Source: Ambulatory Visit | Attending: Internal Medicine | Admitting: Internal Medicine

## 2020-09-13 ENCOUNTER — Other Ambulatory Visit (HOSPITAL_COMMUNITY): Payer: Self-pay | Admitting: Internal Medicine

## 2020-09-13 DIAGNOSIS — Z6832 Body mass index (BMI) 32.0-32.9, adult: Secondary | ICD-10-CM | POA: Diagnosis not present

## 2020-09-13 DIAGNOSIS — E6609 Other obesity due to excess calories: Secondary | ICD-10-CM | POA: Diagnosis not present

## 2020-09-13 DIAGNOSIS — S99912A Unspecified injury of left ankle, initial encounter: Secondary | ICD-10-CM

## 2020-09-13 DIAGNOSIS — E119 Type 2 diabetes mellitus without complications: Secondary | ICD-10-CM | POA: Diagnosis not present

## 2020-09-13 DIAGNOSIS — Z1331 Encounter for screening for depression: Secondary | ICD-10-CM | POA: Diagnosis not present

## 2020-09-13 DIAGNOSIS — M7989 Other specified soft tissue disorders: Secondary | ICD-10-CM | POA: Diagnosis not present

## 2020-09-13 DIAGNOSIS — I10 Essential (primary) hypertension: Secondary | ICD-10-CM | POA: Diagnosis not present

## 2020-09-28 DIAGNOSIS — I10 Essential (primary) hypertension: Secondary | ICD-10-CM | POA: Diagnosis not present

## 2020-09-28 DIAGNOSIS — E119 Type 2 diabetes mellitus without complications: Secondary | ICD-10-CM | POA: Diagnosis not present

## 2020-09-28 DIAGNOSIS — E781 Pure hyperglyceridemia: Secondary | ICD-10-CM | POA: Diagnosis not present

## 2020-10-06 DIAGNOSIS — G252 Other specified forms of tremor: Secondary | ICD-10-CM | POA: Diagnosis not present

## 2020-10-06 DIAGNOSIS — G248 Other dystonia: Secondary | ICD-10-CM | POA: Diagnosis not present

## 2020-10-06 DIAGNOSIS — G251 Drug-induced tremor: Secondary | ICD-10-CM | POA: Diagnosis not present

## 2020-10-29 DIAGNOSIS — I10 Essential (primary) hypertension: Secondary | ICD-10-CM | POA: Diagnosis not present

## 2020-10-29 DIAGNOSIS — E119 Type 2 diabetes mellitus without complications: Secondary | ICD-10-CM | POA: Diagnosis not present

## 2020-10-29 DIAGNOSIS — E781 Pure hyperglyceridemia: Secondary | ICD-10-CM | POA: Diagnosis not present

## 2021-01-19 DIAGNOSIS — G248 Other dystonia: Secondary | ICD-10-CM | POA: Diagnosis not present

## 2021-01-19 DIAGNOSIS — G249 Dystonia, unspecified: Secondary | ICD-10-CM | POA: Diagnosis not present

## 2021-01-19 DIAGNOSIS — Z79899 Other long term (current) drug therapy: Secondary | ICD-10-CM | POA: Diagnosis not present

## 2021-01-19 DIAGNOSIS — G252 Other specified forms of tremor: Secondary | ICD-10-CM | POA: Diagnosis not present

## 2021-02-01 DIAGNOSIS — Z1331 Encounter for screening for depression: Secondary | ICD-10-CM | POA: Diagnosis not present

## 2021-02-01 DIAGNOSIS — Z0000 Encounter for general adult medical examination without abnormal findings: Secondary | ICD-10-CM | POA: Diagnosis not present

## 2021-02-01 DIAGNOSIS — I10 Essential (primary) hypertension: Secondary | ICD-10-CM | POA: Diagnosis not present

## 2021-02-01 DIAGNOSIS — Z23 Encounter for immunization: Secondary | ICD-10-CM | POA: Diagnosis not present

## 2021-02-01 DIAGNOSIS — Z6831 Body mass index (BMI) 31.0-31.9, adult: Secondary | ICD-10-CM | POA: Diagnosis not present

## 2021-02-01 DIAGNOSIS — E782 Mixed hyperlipidemia: Secondary | ICD-10-CM | POA: Diagnosis not present

## 2021-02-01 DIAGNOSIS — E119 Type 2 diabetes mellitus without complications: Secondary | ICD-10-CM | POA: Diagnosis not present

## 2021-03-15 ENCOUNTER — Ambulatory Visit (INDEPENDENT_AMBULATORY_CARE_PROVIDER_SITE_OTHER): Payer: Medicare Other

## 2021-03-15 ENCOUNTER — Encounter: Payer: Self-pay | Admitting: Emergency Medicine

## 2021-03-15 ENCOUNTER — Ambulatory Visit
Admission: EM | Admit: 2021-03-15 | Discharge: 2021-03-15 | Disposition: A | Discharge status: Home / Self Care | Payer: Medicare Other | Attending: Urgent Care | Admitting: Urgent Care

## 2021-03-15 ENCOUNTER — Other Ambulatory Visit: Payer: Self-pay

## 2021-03-15 DIAGNOSIS — B349 Viral infection, unspecified: Secondary | ICD-10-CM | POA: Diagnosis not present

## 2021-03-15 DIAGNOSIS — Z20822 Contact with and (suspected) exposure to covid-19: Secondary | ICD-10-CM

## 2021-03-15 DIAGNOSIS — R051 Acute cough: Secondary | ICD-10-CM

## 2021-03-15 DIAGNOSIS — R0789 Other chest pain: Secondary | ICD-10-CM

## 2021-03-15 DIAGNOSIS — R079 Chest pain, unspecified: Secondary | ICD-10-CM

## 2021-03-15 DIAGNOSIS — R0989 Other specified symptoms and signs involving the circulatory and respiratory systems: Secondary | ICD-10-CM

## 2021-03-15 MED ORDER — IPRATROPIUM BROMIDE 0.03 % NA SOLN
2.0000 | Freq: Two times a day (BID) | NASAL | 0 refills | Status: AC
Start: 2021-03-15 — End: ?

## 2021-03-15 MED ORDER — PSEUDOEPHEDRINE HCL 30 MG PO TABS
30.0000 mg | ORAL_TABLET | Freq: Three times a day (TID) | ORAL | 0 refills | Status: DC | PRN
Start: 2021-03-15 — End: 2022-06-11

## 2021-03-15 MED ORDER — BENZONATATE 100 MG PO CAPS
100.0000 mg | ORAL_CAPSULE | Freq: Three times a day (TID) | ORAL | 0 refills | Status: DC | PRN
Start: 2021-03-15 — End: 2022-04-16

## 2021-03-15 MED ORDER — PROMETHAZINE-DM 6.25-15 MG/5ML PO SYRP
5.0000 mL | ORAL_SOLUTION | Freq: Every evening | ORAL | 0 refills | Status: DC | PRN
Start: 2021-03-15 — End: 2022-06-11

## 2021-03-15 MED ORDER — CETIRIZINE HCL 10 MG PO TABS
10.0000 mg | ORAL_TABLET | Freq: Every day | ORAL | 0 refills | Status: DC
Start: 2021-03-15 — End: 2022-04-16

## 2021-03-15 NOTE — ED Provider Notes (Signed)
[facility]   MRN: [id number] DOB: Jan 17, 1976  Subjective:   Aaron Coffey is a 45 y.o. male presenting for 3-day history of acute onset persistent and worsening cough, chest congestion, sinus congestion.  The cough is causing severe chest pain.  No shortness of breath or wheezing.  No history of respiratory disorders.  No sick contacts in the past week to his knowledge.  No current facility-administered medications for this encounter.  Current Outpatient Medications:    amLODipine (NORVASC) 5 MG tablet, Take 1 tablet (5 mg total) by mouth daily., Disp: 30 tablet, Rfl: 0   benzonatate (TESSALON) 100 MG capsule, Take 1 capsule (100 mg total) by mouth every 8 (eight) hours., Disp: 21 capsule, Rfl: 0   clotrimazole (LOTRIMIN) 1 % cream, Apply to affected area 2 times daily for the next 7 days, Disp: 15 g, Rfl: 0   metFORMIN (GLUCOPHAGE-XR) 500 MG 24 hr tablet, Take 500 mg by mouth 2 (two) times daily., Disp: , Rfl:    Omega-3 Fatty Acids (FISH OIL PO), Take 1 capsule by mouth daily., Disp: , Rfl:    ondansetron (ZOFRAN) 4 MG tablet, Take 1 tablet (4 mg total) by mouth every 8 (eight) hours as needed for nausea or vomiting., Disp: 10 tablet, Rfl: 0   primidone (MYSOLINE) 50 MG tablet, Take 100 mg by mouth 2 (two) times daily., Disp: , Rfl:    simvastatin (ZOCOR) 20 MG tablet, Take 20 mg by mouth every evening., Disp: , Rfl:    trihexyphenidyl (ARTANE) 2 MG tablet, Take 1.5 tablets by mouth daily., Disp: , Rfl: 0   No Known Allergies  Past Medical History:  Diagnosis Date   Dystonia    Essential hypertension    Tremor      Past Surgical History:  Procedure Laterality Date   CHOLECYSTECTOMY     LAPAROSCOPIC APPENDECTOMY N/A 12/19/2017   Procedure: APPENDECTOMY LAPAROSCOPIC;  Surgeon: Franky Macho, MD;  Location: [facility];  Service: General;  Laterality: N/A;    Family History  Problem Relation Age of Onset   Hypertension Mother     Social History   Tobacco  Use   Smoking status: Never   Smokeless tobacco: Never  Vaping Use   Vaping Use: Never used  Substance Use Topics   Alcohol use: Yes    Comment: Occasional   Drug use: No    ROS   Objective:   Vitals: BP (!) 144/90 (BP Location: Right Arm)    Pulse (!) 109    Temp 98.9 F (37.2 C) (Oral)    Resp 18    SpO2 92%   Physical Exam Constitutional:      General: He is not in acute distress.    Appearance: Normal appearance. He is well-developed. He is ill-appearing. He is not toxic-appearing or diaphoretic.  HENT:     Head: Normocephalic and atraumatic.     Right Ear: External ear normal.     Left Ear: External ear normal.     Nose: Nose normal.     Mouth/Throat:     Mouth: Mucous membranes are moist.  Eyes:     General: No scleral icterus.       Right eye: No discharge.        Left eye: No discharge.     Extraocular Movements: Extraocular movements intact.     Pupils: Pupils are equal, round, and reactive to light.  Cardiovascular:     Rate and Rhythm: Normal rate and regular rhythm.  Heart sounds: Normal heart sounds. No murmur heard.   No friction rub. No gallop.  Pulmonary:     Effort: Pulmonary effort is normal. No respiratory distress.     Breath sounds: No stridor. No wheezing, rhonchi or rales.     Comments: Coarse lung sounds over mid fields bilaterally. Neurological:     Mental Status: He is alert and oriented to person, place, and time.  Psychiatric:        Mood and Affect: Mood normal.        Behavior: Behavior normal.        Thought Content: Thought content normal.    DG Chest 2 View  Result Date: 03/15/2021 CLINICAL DATA:  Chest congestion EXAM: CHEST - 2 VIEW COMPARISON:  Chest x-ray dated November 01, 2018 FINDINGS: Cardiac and mediastinal contours within normal limits. Lungs are clear. No evidence of pleural effusion or pneumothorax. IMPRESSION: No active cardiopulmonary disease. Electronically Signed   By: Allegra Lai M.D.   On: 03/15/2021 10:42      Assessment and Plan :   PDMP not reviewed this encounter.  1. Acute viral syndrome   2. Exposure to COVID-19 virus   3. Acute cough   4. Chest congestion   5. Atypical chest pain    Clear cardiopulmonary exam, negative chest x-ray. COVID and flu test pending.  We will otherwise manage for viral upper respiratory infection.  Physical exam findings reassuring and vital signs stable for discharge. Advised supportive care, offered symptomatic relief. Counseled patient on potential for adverse effects with medications prescribed/recommended today, ER and return-to-clinic precautions discussed, patient verbalized understanding.      Wallis Bamberg, PA-C 03/15/21 1102

## 2021-03-15 NOTE — ED Triage Notes (Signed)
Cough, chest congestion, nasal congestion since Tuesday.

## 2021-03-15 NOTE — Discharge Instructions (Signed)
We will notify you of your test results as they arrive and may take between 48-72 hours.  I encourage you to sign up for MyChart if you have not already done so as this can be the easiest way for us to communicate results to you online or through a phone app.  Generally, we only contact you if it is a positive test result.  In the meantime, if you develop worsening symptoms including fever, chest pain, shortness of breath despite our current treatment plan then please report to the emergency room as this may be a sign of worsening status from possible viral infection. ° °Otherwise, we will manage this as a viral syndrome. For sore throat or cough try using a honey-based tea. Use 3 teaspoons of honey with juice squeezed from half lemon. Place shaved pieces of ginger into 1/2-1 cup of water and warm over stove top. Then mix the ingredients and repeat every 4 hours as needed. Please take Tylenol 500mg-650mg every 6 hours for aches and pains, fevers. Hydrate very well with at least 2 liters of water. Eat light meals such as soups to replenish electrolytes and soft fruits, veggies. Start an antihistamine like Zyrtec for postnasal drainage, sinus congestion.  You can take this together with pseudoephedrine (Sudafed) at a dose of 30 mg 2-3 times a day as needed for the same kind of congestion.  Use the cough medications as needed. ° °

## 2021-03-16 LAB — COVID-19, FLU A+B NAA
Influenza A, NAA: NOT DETECTED
Influenza B, NAA: NOT DETECTED
SARS-CoV-2, NAA: NOT DETECTED

## 2021-03-20 ENCOUNTER — Telehealth (HOSPITAL_COMMUNITY): Payer: Self-pay | Admitting: Emergency Medicine

## 2021-03-20 NOTE — Telephone Encounter (Signed)
Have received two voicemails and have made two return calls to patient for results from recent visit, no answer, unable to leave voicemail

## 2021-04-18 DIAGNOSIS — E6609 Other obesity due to excess calories: Secondary | ICD-10-CM | POA: Diagnosis not present

## 2021-04-18 DIAGNOSIS — J069 Acute upper respiratory infection, unspecified: Secondary | ICD-10-CM | POA: Diagnosis not present

## 2021-04-18 DIAGNOSIS — Z6832 Body mass index (BMI) 32.0-32.9, adult: Secondary | ICD-10-CM | POA: Diagnosis not present

## 2021-04-18 DIAGNOSIS — R300 Dysuria: Secondary | ICD-10-CM | POA: Diagnosis not present

## 2021-04-20 DIAGNOSIS — G252 Other specified forms of tremor: Secondary | ICD-10-CM | POA: Diagnosis not present

## 2021-04-20 DIAGNOSIS — G248 Other dystonia: Secondary | ICD-10-CM | POA: Diagnosis not present

## 2021-07-18 DIAGNOSIS — G248 Other dystonia: Secondary | ICD-10-CM | POA: Diagnosis not present

## 2021-07-18 DIAGNOSIS — G252 Other specified forms of tremor: Secondary | ICD-10-CM | POA: Diagnosis not present

## 2021-07-18 DIAGNOSIS — G251 Drug-induced tremor: Secondary | ICD-10-CM | POA: Diagnosis not present

## 2021-10-12 DIAGNOSIS — G248 Other dystonia: Secondary | ICD-10-CM | POA: Diagnosis not present

## 2021-10-12 DIAGNOSIS — G249 Dystonia, unspecified: Secondary | ICD-10-CM | POA: Diagnosis not present

## 2021-12-06 IMAGING — CT CT ABD-PELV W/ CM
2 of 4 series · 17 of 46 positions shown, 19 images · IV contrast (omnipaque)
Comparison: 12/18/2017

CLINICAL DATA: Abdominal pain and diarrhea

EXAM:
CT ABDOMEN AND PELVIS WITH CONTRAST
TECHNIQUE: Multidetector CT imaging of the abdomen and pelvis was performed
using the standard protocol following bolus administration of
intravenous contrast.
CONTRAST:  100mL OMNIPAQUE IOHEXOL 300 MG/ML  SOLN

[Series 2: axial st · axial · 0.92mm/px · z∈[-530,-35]mm · 14 of 109 slices shown, 16 images]
[im 5/109  soft-tissue]
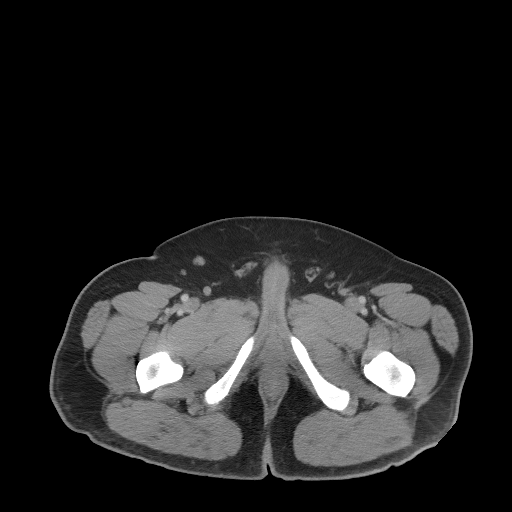
[im 5/109  bone]
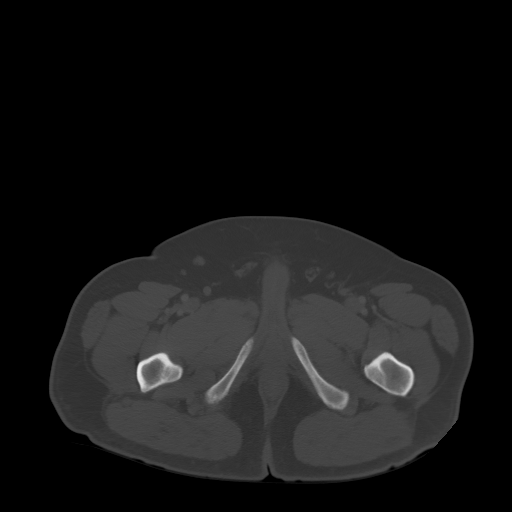
[im 15/109  soft-tissue]
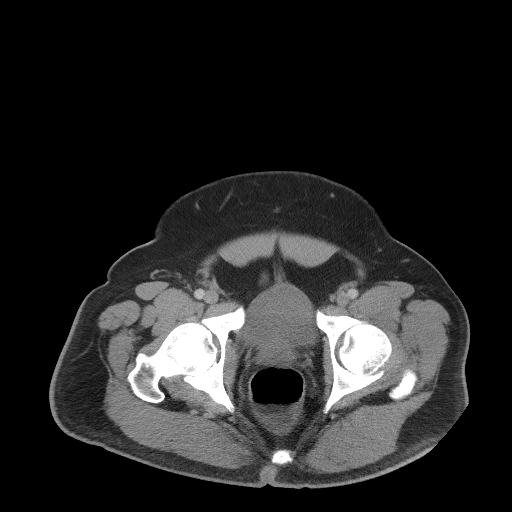
[im 19/109  soft-tissue]
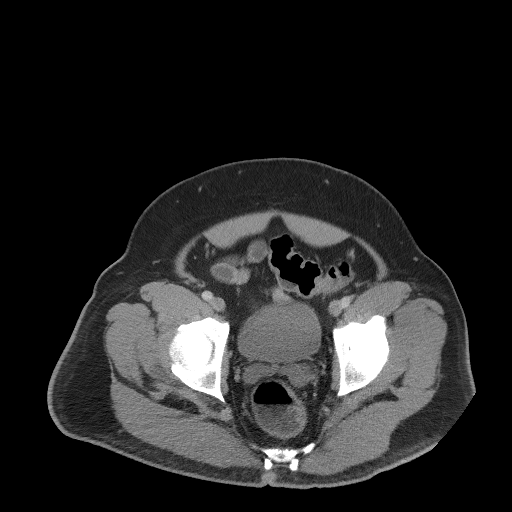
[im 29/109  soft-tissue]
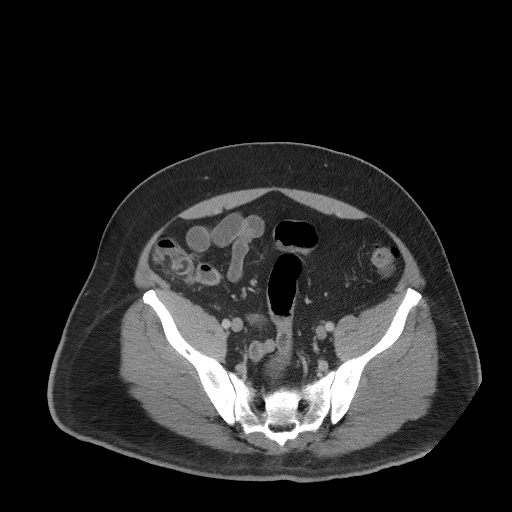
[im 38/109  soft-tissue]
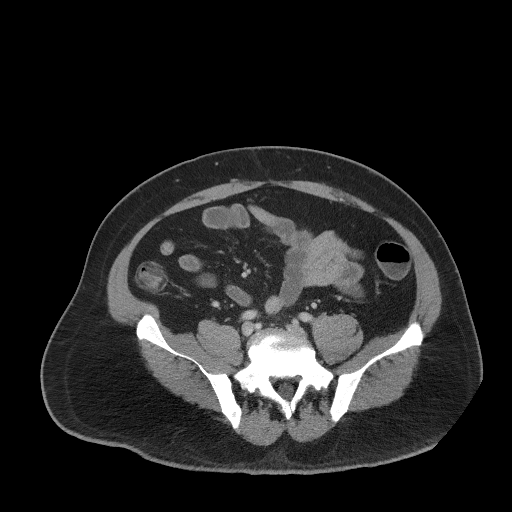
[im 43/109  soft-tissue]
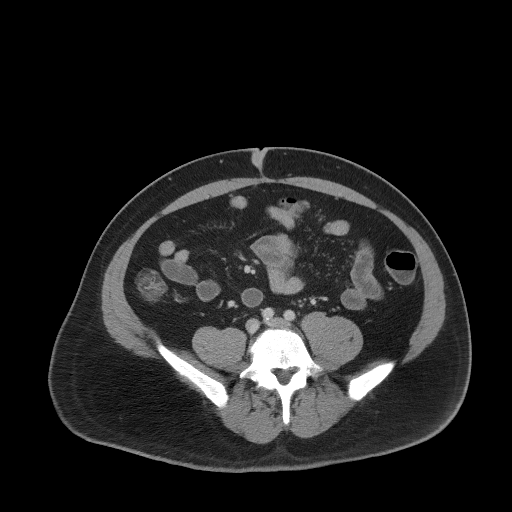
[im 52/109  soft-tissue]
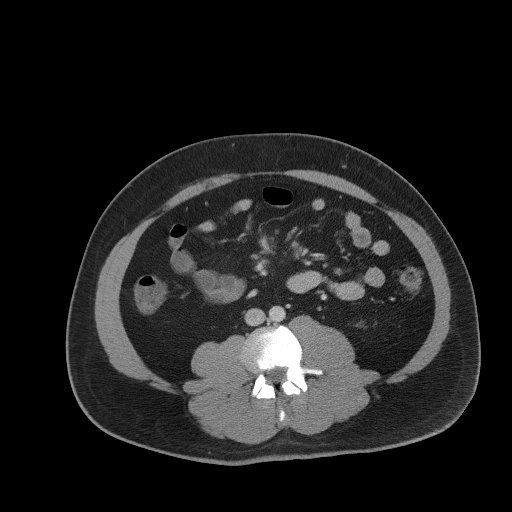
[im 57/109  soft-tissue]
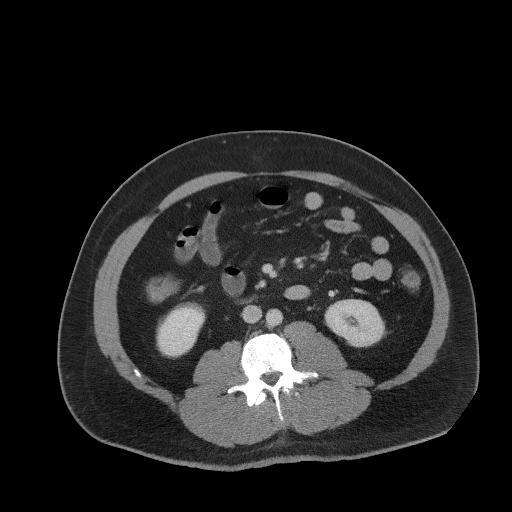
[im 66/109  soft-tissue]
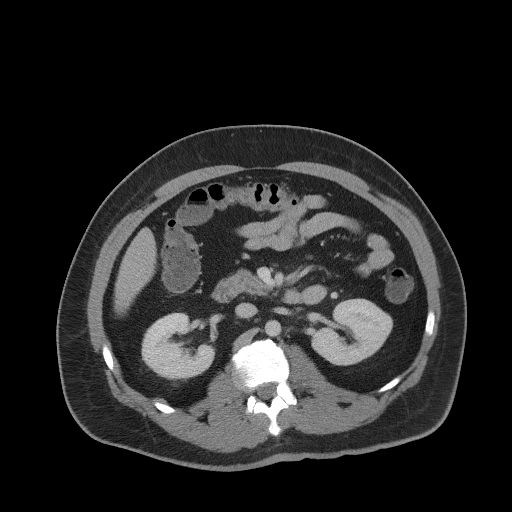
[im 66/109  bone]
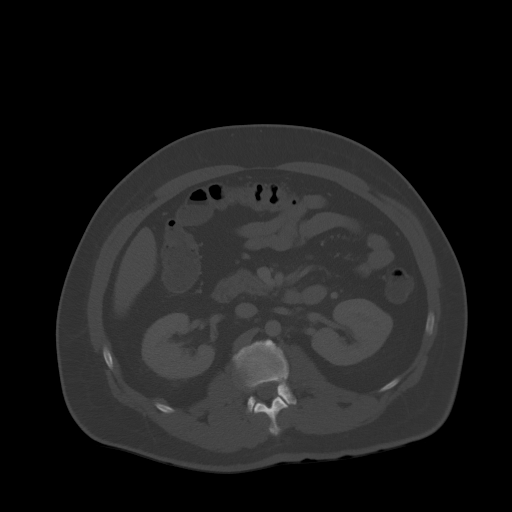
[im 71/109  soft-tissue]
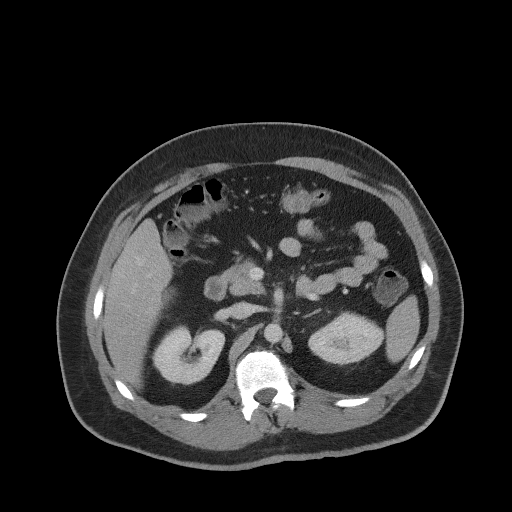
[im 80/109  soft-tissue]
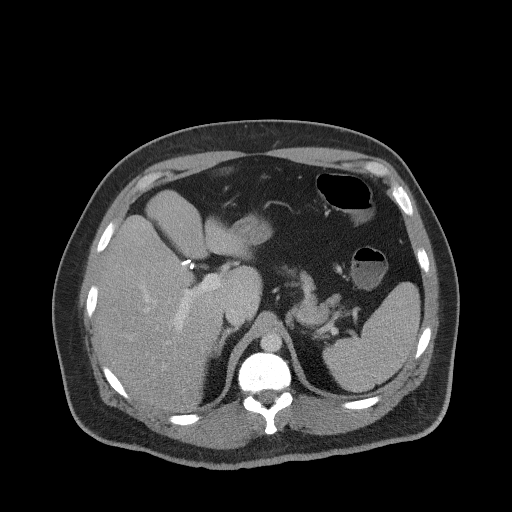
[im 90/109  soft-tissue]
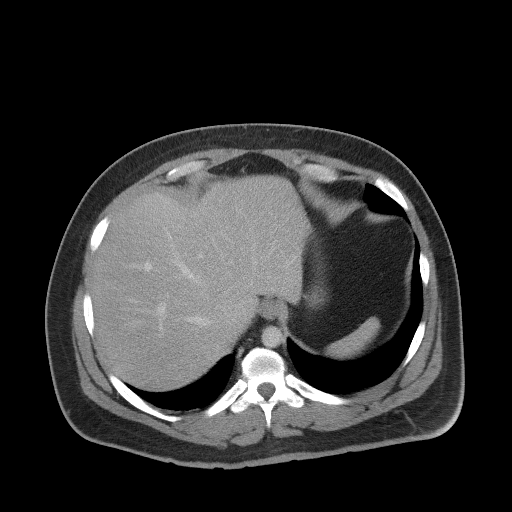
[im 94/109  soft-tissue]
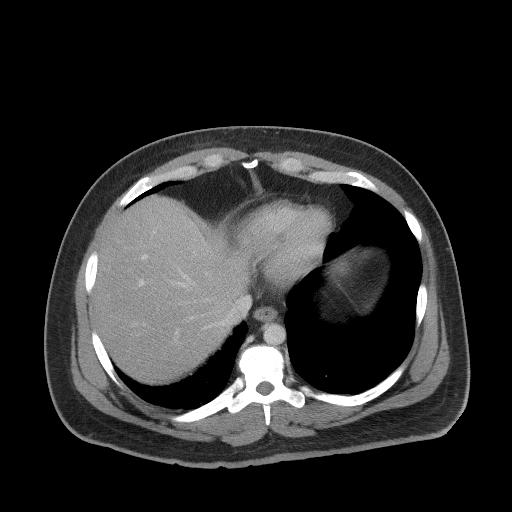
[im 104/109  soft-tissue]
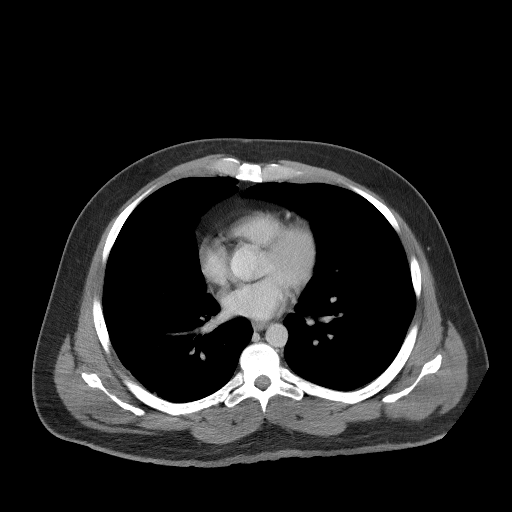

[Series 5: coronal st · coronal · 0.94mm/px · 3 of 99 slices shown]
[im 33/99  soft-tissue]
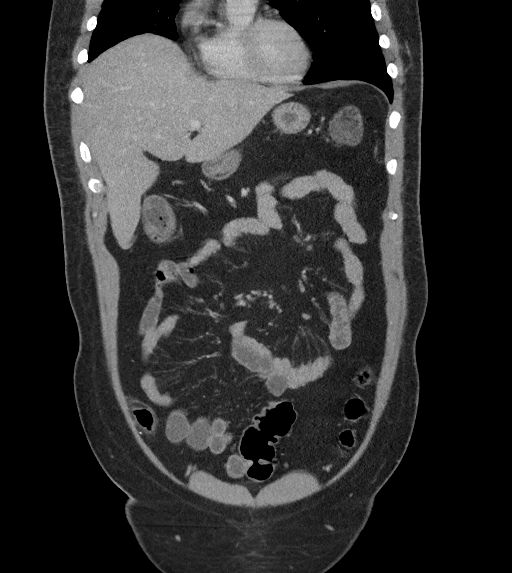
[im 44/99  soft-tissue]
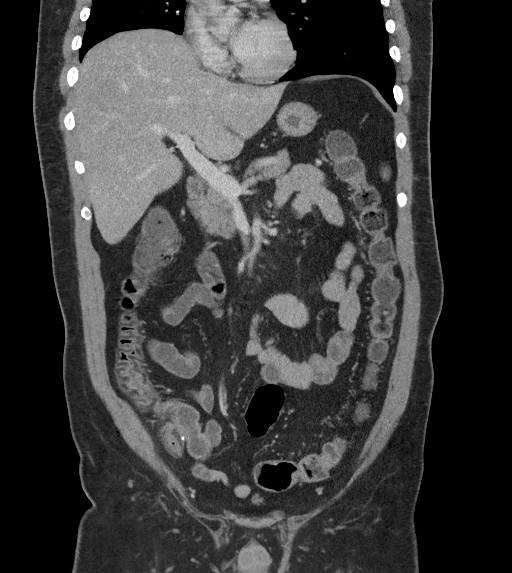
[im 55/99  soft-tissue]
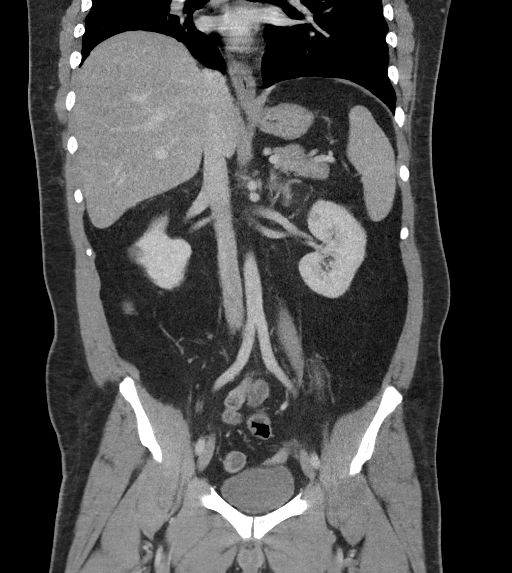

[17 of 46 positions shown; findings below may reference images not displayed]

FINDINGS: LOWER CHEST: Normal.

HEPATOBILIARY: Normal hepatic contours. No intra- or extrahepatic
biliary dilatation. The gallbladder is normal.

PANCREAS: Normal pancreas. No ductal dilatation or peripancreatic
fluid collection.

SPLEEN: Normal.

ADRENALS/URINARY TRACT: The adrenal glands are normal. No
hydronephrosis, nephroureterolithiasis or solid renal mass. The
urinary bladder is normal for degree of distention

STOMACH/BOWEL: There is no hiatal hernia. Normal duodenal course and
caliber. No small bowel dilatation or inflammation. No focal colonic
abnormality. Status post appendectomy.

VASCULAR/LYMPHATIC: Normal course and caliber of the major abdominal
vessels. No abdominal or pelvic lymphadenopathy.

REPRODUCTIVE: Normal prostate size with symmetric seminal vesicles.

MUSCULOSKELETAL. No bony spinal canal stenosis or focal osseous
abnormality.

OTHER: None.
IMPRESSION: No acute abnormality of the abdomen or pelvis.

## 2021-12-11 DIAGNOSIS — E782 Mixed hyperlipidemia: Secondary | ICD-10-CM | POA: Diagnosis not present

## 2021-12-11 DIAGNOSIS — E1165 Type 2 diabetes mellitus with hyperglycemia: Secondary | ICD-10-CM | POA: Diagnosis not present

## 2021-12-11 DIAGNOSIS — F909 Attention-deficit hyperactivity disorder, unspecified type: Secondary | ICD-10-CM | POA: Diagnosis not present

## 2021-12-11 DIAGNOSIS — Z125 Encounter for screening for malignant neoplasm of prostate: Secondary | ICD-10-CM | POA: Diagnosis not present

## 2021-12-11 DIAGNOSIS — Z23 Encounter for immunization: Secondary | ICD-10-CM | POA: Diagnosis not present

## 2021-12-11 DIAGNOSIS — I10 Essential (primary) hypertension: Secondary | ICD-10-CM | POA: Diagnosis not present

## 2021-12-11 DIAGNOSIS — G250 Essential tremor: Secondary | ICD-10-CM | POA: Diagnosis not present

## 2021-12-11 DIAGNOSIS — Z1331 Encounter for screening for depression: Secondary | ICD-10-CM | POA: Diagnosis not present

## 2021-12-11 DIAGNOSIS — E6609 Other obesity due to excess calories: Secondary | ICD-10-CM | POA: Diagnosis not present

## 2021-12-11 DIAGNOSIS — F70 Mild intellectual disabilities: Secondary | ICD-10-CM | POA: Diagnosis not present

## 2021-12-11 DIAGNOSIS — Z6829 Body mass index (BMI) 29.0-29.9, adult: Secondary | ICD-10-CM | POA: Diagnosis not present

## 2021-12-12 LAB — COMPREHENSIVE METABOLIC PANEL: eGFR: 111

## 2021-12-12 LAB — BASIC METABOLIC PANEL
BUN: 10 (ref 4–21)
Creatinine: 0.8 (ref 0.6–1.3)

## 2021-12-12 LAB — LIPID PANEL
LDL Cholesterol: 72
Triglycerides: 443 — AB (ref 40–160)

## 2021-12-12 LAB — TSH: TSH: 2.85 (ref 0.41–5.90)

## 2021-12-12 LAB — HEMOGLOBIN A1C: Hemoglobin A1C: 15.4

## 2022-01-18 DIAGNOSIS — G252 Other specified forms of tremor: Secondary | ICD-10-CM | POA: Diagnosis not present

## 2022-01-18 DIAGNOSIS — G249 Dystonia, unspecified: Secondary | ICD-10-CM | POA: Diagnosis not present

## 2022-02-20 DIAGNOSIS — F909 Attention-deficit hyperactivity disorder, unspecified type: Secondary | ICD-10-CM | POA: Diagnosis not present

## 2022-02-20 DIAGNOSIS — G250 Essential tremor: Secondary | ICD-10-CM | POA: Diagnosis not present

## 2022-02-20 DIAGNOSIS — Z6830 Body mass index (BMI) 30.0-30.9, adult: Secondary | ICD-10-CM | POA: Diagnosis not present

## 2022-02-20 DIAGNOSIS — F70 Mild intellectual disabilities: Secondary | ICD-10-CM | POA: Diagnosis not present

## 2022-02-20 DIAGNOSIS — E782 Mixed hyperlipidemia: Secondary | ICD-10-CM | POA: Diagnosis not present

## 2022-02-20 DIAGNOSIS — E6609 Other obesity due to excess calories: Secondary | ICD-10-CM | POA: Diagnosis not present

## 2022-02-20 DIAGNOSIS — I10 Essential (primary) hypertension: Secondary | ICD-10-CM | POA: Diagnosis not present

## 2022-02-20 DIAGNOSIS — E1165 Type 2 diabetes mellitus with hyperglycemia: Secondary | ICD-10-CM | POA: Diagnosis not present

## 2022-02-20 DIAGNOSIS — Z1331 Encounter for screening for depression: Secondary | ICD-10-CM | POA: Diagnosis not present

## 2022-02-20 DIAGNOSIS — Z0000 Encounter for general adult medical examination without abnormal findings: Secondary | ICD-10-CM | POA: Diagnosis not present

## 2022-04-15 NOTE — Patient Instructions (Signed)

## 2022-04-16 ENCOUNTER — Ambulatory Visit (INDEPENDENT_AMBULATORY_CARE_PROVIDER_SITE_OTHER): Payer: Medicare Other | Admitting: Nurse Practitioner

## 2022-04-16 ENCOUNTER — Encounter: Payer: Self-pay | Admitting: Nurse Practitioner

## 2022-04-16 VITALS — BP 138/95 | HR 78 | Ht 66.0 in | Wt 206.4 lb

## 2022-04-16 DIAGNOSIS — E1165 Type 2 diabetes mellitus with hyperglycemia: Secondary | ICD-10-CM

## 2022-04-16 LAB — POCT GLYCOSYLATED HEMOGLOBIN (HGB A1C): Hemoglobin A1C: 6 % — AB (ref 4.0–5.6)

## 2022-04-16 NOTE — Progress Notes (Signed)
Endocrinology Consult Note       04/16/2022, 10:45 AM   Subjective:    Patient ID: Aaron Coffey, male    DOB: 08-18-1975.  Micheal Likens is being seen in consultation for management of currently uncontrolled symptomatic diabetes requested by  Elfredia Nevins, MD.   Past Medical History:  Diagnosis Date   Dystonia    Essential hypertension    Tremor     Past Surgical History:  Procedure Laterality Date   CHOLECYSTECTOMY     LAPAROSCOPIC APPENDECTOMY N/A 12/19/2017   Procedure: APPENDECTOMY LAPAROSCOPIC;  Surgeon: Franky Macho, MD;  Location: [facility];  Service: General;  Laterality: N/A;    Social History   Socioeconomic History   Marital status: Single    Spouse name: Not on file   Number of children: Not on file   Years of education: Not on file   Highest education level: Not on file  Occupational History   Not on file  Tobacco Use   Smoking status: Never   Smokeless tobacco: Never  Vaping Use   Vaping Use: Never used  Substance and Sexual Activity   Alcohol use: Yes    Comment: Occasional   Drug use: No   Sexual activity: Not on file  Other Topics Concern   Not on file  Social History Narrative   Not on file   Social Determinants of Health   Financial Resource Strain: Not on file  Food Insecurity: Not on file  Transportation Needs: Not on file  Physical Activity: Not on file  Stress: Not on file  Social Connections: Not on file    Family History  Problem Relation Age of Onset   Hypertension Mother     Outpatient Encounter Medications as of 04/16/2022  Medication Sig   amLODipine (NORVASC) 5 MG tablet Take 1 tablet (5 mg total) by mouth daily.   ipratropium (ATROVENT) 0.03 % nasal spray Place 2 sprays into both nostrils 2 (two) times daily.   JANUMET 50-1000 MG tablet Take 1 tablet by mouth 2 (two) times daily.   lisinopril (ZESTRIL) 5 MG tablet Take 5 mg by mouth  daily.   Omega-3 Fatty Acids (FISH OIL PO) Take 1 capsule by mouth daily.   primidone (MYSOLINE) 50 MG tablet Take 100 mg by mouth 2 (two) times daily.   promethazine-dextromethorphan (PROMETHAZINE-DM) 6.25-15 MG/5ML syrup Take 5 mLs by mouth at bedtime as needed for cough.   pseudoephedrine (SUDAFED) 30 MG tablet Take 1 tablet (30 mg total) by mouth every 8 (eight) hours as needed for congestion.   simvastatin (ZOCOR) 20 MG tablet Take 20 mg by mouth every evening.   trihexyphenidyl (ARTANE) 2 MG tablet Take 1.5 tablets by mouth daily.   [DISCONTINUED] benzonatate (TESSALON) 100 MG capsule Take 1 capsule (100 mg total) by mouth 3 (three) times daily as needed for cough. (Patient not taking: Reported on 04/16/2022)   [DISCONTINUED] cetirizine (ZYRTEC ALLERGY) 10 MG tablet Take 1 tablet (10 mg total) by mouth daily. (Patient not taking: Reported on 04/16/2022)   [DISCONTINUED] clotrimazole (LOTRIMIN) 1 % cream Apply to affected area 2 times daily for the next 7 days (Patient not taking: Reported on 04/16/2022)   [  DISCONTINUED] metFORMIN (GLUCOPHAGE-XR) 500 MG 24 hr tablet Take 500 mg by mouth 2 (two) times daily. (Patient not taking: Reported on 04/16/2022)   [DISCONTINUED] ondansetron (ZOFRAN) 4 MG tablet Take 1 tablet (4 mg total) by mouth every 8 (eight) hours as needed for nausea or vomiting. (Patient not taking: Reported on 04/16/2022)   No facility-administered encounter medications on file as of 04/16/2022.    ALLERGIES: No Known Allergies  VACCINATION STATUS: Immunization History  Administered Date(s) Administered   Moderna Sars-Covid-2 Vaccination 06/19/2019, 07/20/2019    Diabetes He presents for his initial diabetic visit. He has type 2 diabetes mellitus. Onset time: Diagnosed at approx age of 61. His disease course has been improving. There are no hypoglycemic associated symptoms. Associated symptoms include weight loss. There are no hypoglycemic complications. There are no diabetic  complications. Risk factors for coronary artery disease include diabetes mellitus, family history, obesity, male sex, hypertension, dyslipidemia and sedentary lifestyle. Current diabetic treatment includes oral agent (dual therapy). He is compliant with treatment most of the time. His weight is decreasing steadily. He is following a generally unhealthy diet. When asked about meal planning, he reported none. He has not had a previous visit with a dietitian. He rarely participates in exercise. (He presents today for his consultation, accompanied by his mother, with no meter or logs to review.  His POCT A1c today is 6%, improving drastically from last A1c of 15%.  He monitors glucose once daily.  He drinks gatorade zero, eats 2 meals per day plus snacks (skips lunch usually).  He does not engage in routine physical activity.  He is UTD on eye exam, has never seen podiatry in the past.) An ACE inhibitor/angiotensin II receptor blocker is being taken. He does not see a podiatrist.Eye exam is current.     Review of systems  Constitutional: + Minimally fluctuating body weight, current Body mass index is 33.31 kg/m., no fatigue, no subjective hyperthermia, no subjective hypothermia Eyes: no blurry vision, no xerophthalmia, right eye deviates from midline ENT: no sore throat, no nodules palpated in throat, no dysphagia/odynophagia, no hoarseness Cardiovascular: no chest pain, no shortness of breath, no palpitations, no leg swelling Respiratory: no cough, no shortness of breath Gastrointestinal: no nausea/vomiting/diarrhea Musculoskeletal: no muscle/joint aches Skin: no rashes, no hyperemia Neurological: + essential tremor, no numbness, no tingling, no dizziness Psychiatric: no depression, no anxiety  Objective:     BP (!) 138/95 (BP Location: Left Arm, Patient Position: Sitting, Cuff Size: Large)   Pulse 78   Ht 5\' 6"  (1.676 m)   Wt 206 lb 6.4 oz (93.6 kg)   BMI 33.31 kg/m   Wt Readings from Last 3  Encounters:  04/16/22 206 lb 6.4 oz (93.6 kg)  07/27/19 224 lb (101.6 kg)  01/19/19 223 lb (101.2 kg)     BP Readings from Last 3 Encounters:  04/16/22 (!) 138/95  03/15/21 (!) 144/90  06/02/20 (!) 144/97     Physical Exam- Limited  Constitutional:  Body mass index is 33.31 kg/m. , not in acute distress, normal state of mind, mild cognitive impairment Eyes:  EOMI, no exophthalmos, right eye deviates from midline Neck: Supple Musculoskeletal: no gross deformities, strength intact in all four extremities, no gross restriction of joint movements Skin:  no rashes, no hyperemia    CMP ( most recent) CMP     Component Value Date/Time   NA 132 (L) 07/27/2019 2129   K 3.5 07/27/2019 2129   CL 100 07/27/2019 2129   CO2  22 07/27/2019 2129   GLUCOSE 217 (H) 07/27/2019 2129   BUN 10 12/12/2021 0000   CREATININE 0.8 12/12/2021 0000   CREATININE 0.91 07/27/2019 2129   CALCIUM 8.8 (L) 07/27/2019 2129   PROT 7.6 07/27/2019 2129   ALBUMIN 4.2 07/27/2019 2129   AST 17 07/27/2019 2129   ALT 27 07/27/2019 2129   ALKPHOS 72 07/27/2019 2129   BILITOT 0.8 07/27/2019 2129   GFRNONAA >60 07/27/2019 2129   GFRAA >60 07/27/2019 2129     Diabetic Labs (most recent): Lab Results  Component Value Date   HGBA1C 6.0 (A) 04/16/2022   HGBA1C 15.4 12/12/2021   HGBA1C 8.0 (H) 12/18/2017     Lipid Panel ( most recent) Lipid Panel     Component Value Date/Time   TRIG 443 (A) 12/12/2021 0000   LDLCALC 72 12/12/2021 0000      Lab Results  Component Value Date   TSH 2.85 12/12/2021           Assessment & Plan:   1) Type 2 diabetes mellitus with hyperglycemia, without long-term current use of insulin (HCC)  He presents today for his consultation, accompanied by his mother, with no meter or logs to review.  His POCT A1c today is 6%, improving drastically from last A1c of 15%.  He monitors glucose once daily.  He drinks gatorade zero, eats 2 meals per day plus snacks (skips lunch  usually).  He does not engage in routine physical activity.  He is UTD on eye exam, has never seen podiatry in the past.  - LAWTON DOLLINGER has currently uncontrolled symptomatic type 2 DM since 47 years of age, with most recent A1c of 6 %.   -Recent labs reviewed.  - I had a long discussion with him about the progressive nature of diabetes and the pathology behind its complications. -his diabetes is not currently complicated but he remains at a high risk for more acute and chronic complications which include CAD, CVA, CKD, retinopathy, and neuropathy. These are all discussed in detail with him.  The following Lifestyle Medicine recommendations according to American College of Lifestyle Medicine [facility]) were discussed and offered to patient and he agrees to start the journey:  A. Whole Foods, Plant-based plate comprising of fruits and vegetables, plant-based proteins, whole-grain carbohydrates was discussed in detail with the patient.   A list for source of those nutrients were also provided to the patient.  Patient will use only water or unsweetened tea for hydration. B.  The need to stay away from risky substances including alcohol, smoking; obtaining 7 to 9 hours of restorative sleep, at least 150 minutes of moderate intensity exercise weekly, the importance of healthy social connections,  and stress reduction techniques were discussed. C.  A full color page of  Calorie density of various food groups per pound showing examples of each food groups was provided to the patient.  - I have counseled him on diet and weight management by adopting a carbohydrate restricted/protein rich diet. Patient is encouraged to switch to unprocessed or minimally processed complex starch and increased protein intake (animal or plant source), fruits, and vegetables. -  he is advised to stick to a routine mealtimes to eat 3 meals a day and avoid unnecessary snacks (to snack only to correct hypoglycemia).   - he  acknowledges that there is a room for improvement in his food and drink choices. - Suggestion is made for him to avoid simple carbohydrates from his diet including Cakes, Sweet  Desserts, Ice Cream, Soda (diet and regular), Sweet Tea, Candies, Chips, Cookies, Store Bought Juices, Alcohol in Excess of 1-2 drinks a day, Artificial Sweeteners, Coffee Creamer, and "Sugar-free" Products. This will help patient to have more stable blood glucose profile and potentially avoid unintended weight gain.  - I have approached him with the following individualized plan to manage his diabetes and patient agrees:   He will be scheduled with Jearld Fenton, RDE for diet and diabetes education.  -he is encouraged to start monitoring glucose 2 times daily, before breakfast and before bed, to log their readings on the clinic sheets provided, and bring them to review at follow up appointment in 3 months.  - Adjustment parameters are given to him for hypo and hyperglycemia in writing. - he is encouraged to call clinic for blood glucose levels less than 70 or above 300 mg /dl. - he is advised to continue Janumet 50/1000 mg po twice daily, therapeutically suitable for patient .  - Specific targets for  A1c; LDL, HDL, and Triglycerides were discussed with the patient.  2) Blood Pressure /Hypertension:  his blood pressure is controlled to target.   he is advised to continue his current medications including Lisinopril 5 mg p.o. daily with breakfast, and Norvasc 5 mg po daily.Marland Kitchen  3) Lipids/Hyperlipidemia:    Review of his recent lipid panel from 12/12/21 showed controlled LDL at 72 and elevated triglycerides of 443 .  he is advised to continue Simvastatin 20 mg daily at bedtime.  Side effects and precautions discussed with him.  4)  Weight/Diet:  his Body mass index is 33.31 kg/m.  -  clearly complicating his diabetes care.   he is a candidate for weight loss. I discussed with him the fact that loss of 5 - 10% of his   current body weight will have the most impact on his diabetes management.  Exercise, and detailed carbohydrates information provided  -  detailed on discharge instructions.  5) Chronic Care/Health Maintenance: -he is on ACEI/ARB and Statin medications and is encouraged to initiate and continue to follow up with Ophthalmology, Dentist, Podiatrist at least yearly or according to recommendations, and advised to stay away from smoking. I have recommended yearly flu vaccine and pneumonia vaccine at least every 5 years; moderate intensity exercise for up to 150 minutes weekly; and sleep for at least 7 hours a day.  - he is advised to maintain close follow up with Redmond School, MD for primary care needs, as well as his other providers for optimal and coordinated care.   - Time spent in this patient care: 60 min, of which > 50% was spent in counseling him about his diabetes and the rest reviewing his blood glucose logs, discussing his hypoglycemia and hyperglycemia episodes, reviewing his current and previous labs/studies (including abstraction from other facilities) and medications doses and developing a long term treatment plan based on the latest standards of care/guidelines; and documenting his care.    Please refer to Patient Instructions for Blood Glucose Monitoring and Insulin/Medications Dosing Guide" in media tab for additional information. Please also refer to "Patient Self Inventory" in the Media tab for reviewed elements of pertinent patient history.  [name] participated in the discussions, expressed understanding, and voiced agreement with the above plans.  All questions were answered to his satisfaction. he is encouraged to contact clinic should he have any questions or concerns prior to his return visit.     Follow up plan: - Return in about  3 months (around 07/16/2022) for Diabetes F/U with A1c in office, No previsit labs, Bring meter and logs.    Rayetta Pigg,  [facility] [address] [place], [place] [address] Phone: [phone] Fax: [phone]  04/16/2022, 10:45 AM

## 2022-05-03 DIAGNOSIS — G248 Other dystonia: Secondary | ICD-10-CM | POA: Diagnosis not present

## 2022-05-03 DIAGNOSIS — Z79899 Other long term (current) drug therapy: Secondary | ICD-10-CM | POA: Diagnosis not present

## 2022-05-03 DIAGNOSIS — G249 Dystonia, unspecified: Secondary | ICD-10-CM | POA: Diagnosis not present

## 2022-05-03 DIAGNOSIS — G252 Other specified forms of tremor: Secondary | ICD-10-CM | POA: Diagnosis not present

## 2022-05-30 ENCOUNTER — Encounter: Payer: Self-pay | Admitting: *Deleted

## 2022-05-30 ENCOUNTER — Telehealth: Payer: Self-pay | Admitting: *Deleted

## 2022-05-30 DIAGNOSIS — F988 Other specified behavioral and emotional disorders with onset usually occurring in childhood and adolescence: Secondary | ICD-10-CM | POA: Insufficient documentation

## 2022-05-30 DIAGNOSIS — E119 Type 2 diabetes mellitus without complications: Secondary | ICD-10-CM | POA: Insufficient documentation

## 2022-05-30 DIAGNOSIS — E785 Hyperlipidemia, unspecified: Secondary | ICD-10-CM | POA: Insufficient documentation

## 2022-05-30 DIAGNOSIS — R251 Tremor, unspecified: Secondary | ICD-10-CM | POA: Insufficient documentation

## 2022-05-30 NOTE — Patient Outreach (Signed)
  Care Coordination   Initial Visit Note   05/30/2022 Name: Aaron Coffey MRN: [id number] DOB: 11-01-1975  Aaron Coffey is a 47 y.o. year old male who sees [employer], MD for primary care. I spoke with  pt's mother, Ashwin Demond by phone today.  What matters to the patients health and wellness today?  MRS. Luty WANTS TO THINK ABOUT PARTICIPATION IN THE HEALTH EQUITY PLAN. SHE WOULD NEED TO ASSIST HER SON IN THE ENDEAVOR. NP EXPLAINED PROGRAM AND THAT WE WOULD COLLABORATE WITH DR. Gerarda Fraction TO ENSURE HE IS GETTING ALL HIS NEEDS MET.    TODAY'S DISCUSSION REVEALED THAT AMONG THE SDOH NEEDS PT IS VERY SEDENTARY AND SOMEWHAT ISOLATED. EXERCISE WAS ENCOURAGED. HIS LAST A1C WAS 6.O AND HE IS SEEING A DIETICIAN. HE HAS HTN, ON 2 MEDS, DOES NOT MONITOR AT HOME.  HIS TRIGLYCERIDES ARE ELEVATED.   ENCOURAGED MRS. Fan TO TALK WITH DR. Gerarda Fraction OR SHERRY REGARDING THE BENEFITS OF THE HEALTH EQUITY PLAN.  [facility] CAN PROVIDE ADDITIONAL EDUCATION TO SUPPLEMENT PRIMARY AND ENDOCRINOLOGY EFFORTS.   Goals Addressed   None     SDOH assessments and interventions completed:  Yes  SDOH Interventions Today    Flowsheet Row Most Recent Value  SDOH Interventions   Food Insecurity Interventions Intervention Not Indicated  Housing Interventions Intervention Not Indicated  Alcohol Usage Interventions Intervention Not Indicated (Score <7)  Financial Strain Interventions Intervention Not Indicated  Physical Activity Interventions Other (Comments)  [Pt has a membership at [employer] but has not been going. He depends on his mother to take him. Encouraged them to go together for both of their benefit.]  Stress Interventions Intervention Not Indicated  Social Connections Interventions Intervention Not Indicated        Care Coordination Interventions:  Yes, provided   Follow up plan: Follow up call scheduled for 2 WEEKS.    Encounter Outcome:  Pt. Visit Completed   Kayleen Memos C. Myrtie Neither,  MSN, [facility] Gerontological Nurse Practitioner [facility] Care Management [phone]

## 2022-06-05 ENCOUNTER — Ambulatory Visit
Admission: EM | Admit: 2022-06-05 | Discharge: 2022-06-05 | Disposition: A | Discharge status: Home / Self Care | Payer: Medicare Other | Attending: Nurse Practitioner | Admitting: Nurse Practitioner

## 2022-06-05 ENCOUNTER — Encounter: Payer: Self-pay | Admitting: Emergency Medicine

## 2022-06-05 DIAGNOSIS — M545 Low back pain, unspecified: Secondary | ICD-10-CM | POA: Diagnosis not present

## 2022-06-05 LAB — POCT URINALYSIS DIP (MANUAL ENTRY)
Bilirubin, UA: NEGATIVE
Blood, UA: NEGATIVE
Glucose, UA: NEGATIVE mg/dL
Leukocytes, UA: NEGATIVE
Nitrite, UA: NEGATIVE
Protein Ur, POC: NEGATIVE mg/dL
Spec Grav, UA: >=1.03 — AB (ref 1.010–1.025)
Urobilinogen, UA: 0.2 E.U./dL
pH, UA: 5.5 (ref 5.0–8.0)

## 2022-06-05 MED ORDER — DEXAMETHASONE SODIUM PHOSPHATE 10 MG/ML IJ SOLN
10.0000 mg | INTRAMUSCULAR | Status: AC
  Administered 2022-06-05: 10 mg via INTRAMUSCULAR

## 2022-06-05 MED ORDER — IBUPROFEN 800 MG PO TABS: 800.0000 mg | ORAL_TABLET | Freq: Three times a day (TID) | ORAL | 0 refills | Status: DC

## 2022-06-05 MED ORDER — KETOROLAC TROMETHAMINE 30 MG/ML IJ SOLN
30.0000 mg | Freq: Once | INTRAMUSCULAR | Status: AC
  Administered 2022-06-05: 30 mg via INTRAMUSCULAR

## 2022-06-05 NOTE — ED Triage Notes (Signed)
Lower right side back pain since yesterday.  No known injury.  States pain worse with movement.

## 2022-06-05 NOTE — Discharge Instructions (Addendum)
The urinalysis did not indicate any signs of infection. Take medication as prescribed.  Please do not start the ibuprofen until 05/31/2022.  You were given an injection to help with pain that should last you throughout the evening. Increase fluids and allow for plenty of rest. Recommend staying as active as possible at this time.  Providing you stretching exercises that you can do at home to help with your symptoms. Recommend the use of ice or heat as needed.  Apply ice for pain or swelling, heat for spasm or stiffness.  Apply for 20 minutes, remove for 1 hour, then repeat is much as possible. Go to the emergency department immediately if you experience worsening low back pain, become unable to walk, lose control of your bowel or bladder, or have weakness in your lower extremities. If symptoms fail to improve, please follow-up with your primary care physician for further evaluation. Follow-up as needed.

## 2022-06-05 NOTE — ED Provider Notes (Signed)
[facility]    CSN: [id number] Arrival date & time: 06/05/22  1641      History   Chief Complaint No chief complaint on file.   HPI Aaron Coffey is a 47 y.o. male.   The history is provided by the patient and a parent.   The patient presents with his mother for complaints of right-sided low back pain.  Patient is holding his right flank area as he describes where the pain is located.  Patient states symptoms started over the past 24 hours.  The patient's mother states that she and the patient went to eat yesterday.  She states when they were getting out of the booth, she suspects that he may have "turned wrong".  She states that when they came home, he began to complain of pain in his lower back.  Patient describes the pain as "throbbing".  Patient denies injury or trauma, any heavy lifting, falls, numbness, tingling, radiation of pain, urinary frequency, urgency, hesitancy, or pain with urination..  Patient reports he has been taking Tylenol for his symptoms with minimal relief. Past Medical History:  Diagnosis Date   Dystonia    Essential hypertension    Tremor     Patient Active Problem List   Diagnosis Date Noted   ADD (attention deficit disorder) without hyperactivity 05/30/2022   Diabetes mellitus ([place]) 05/30/2022   Hyperlipidemia 05/30/2022   Tremor 05/30/2022   Appendicitis 12/19/2017   Intentional self-harm by knife ([place])    Adjustment disorder with mixed disturbance of emotions and conduct     Past Surgical History:  Procedure Laterality Date   CHOLECYSTECTOMY     LAPAROSCOPIC APPENDECTOMY N/A 12/19/2017   Procedure: APPENDECTOMY LAPAROSCOPIC;  Surgeon: Aviva Signs, MD;  Location: [facility];  Service: General;  Laterality: N/A;       Home Medications    Prior to Admission medications   Medication Sig Start Date End Date Taking? Authorizing Provider  ibuprofen (ADVIL) 800 MG tablet Take 1 tablet (800 mg total) by mouth 3 (three) times daily.  06/05/22  Yes Darrall Strey-Warren, Alda Lea, NP  amLODipine (NORVASC) 5 MG tablet Take 1 tablet (5 mg total) by mouth daily. 11/01/18   Noemi Chapel, MD  ipratropium (ATROVENT) 0.03 % nasal spray Place 2 sprays into both nostrils 2 (two) times daily. 03/15/21   Jaynee Eagles, PA-C  JANUMET 50-1000 MG tablet Take 1 tablet by mouth 2 (two) times daily.    [provider]  lisinopril (ZESTRIL) 5 MG tablet Take 5 mg by mouth daily.    [provider]  Omega-3 Fatty Acids (FISH OIL PO) Take 1 capsule by mouth daily.    [provider]  primidone (MYSOLINE) 50 MG tablet Take 100 mg by mouth 2 (two) times daily. 01/10/14   [provider]  promethazine-dextromethorphan (PROMETHAZINE-DM) 6.25-15 MG/5ML syrup Take 5 mLs by mouth at bedtime as needed for cough. Patient not taking: Reported on 05/30/2022 03/15/21   Jaynee Eagles, PA-C  pseudoephedrine (SUDAFED) 30 MG tablet Take 1 tablet (30 mg total) by mouth every 8 (eight) hours as needed for congestion. Patient not taking: Reported on 05/30/2022 03/15/21   Jaynee Eagles, PA-C  simvastatin (ZOCOR) 20 MG tablet Take 20 mg by mouth every evening. 01/10/14   [provider]  trihexyphenidyl (ARTANE) 2 MG tablet Take 1.5 tablets by mouth daily. 10/12/17   [provider]    Family History Family History  Problem Relation Age of Onset   Hypertension Mother  Social History Social History   Tobacco Use   Smoking status: Never   Smokeless tobacco: Never  Vaping Use   Vaping Use: Never used  Substance Use Topics   Alcohol use: Yes    Alcohol/week: 1.0 standard drink of alcohol    Types: 1 Cans of beer per week    Comment: Occasional   Drug use: No     Allergies   Patient has no known allergies.   Review of Systems Review of Systems Per HPI  Physical Exam Triage Vital Signs ED Triage Vitals  Enc Vitals Group     BP 06/05/22 1647 124/87     Pulse Rate 06/05/22 1647 95     Resp 06/05/22 1647 18      Temp 06/05/22 1647 98.4 F (36.9 C)     Temp Source 06/05/22 1647 Oral     SpO2 06/05/22 1647 98 %     Weight --      Height --      Head Circumference --      Peak Flow --      Pain Score 06/05/22 1648 8     Pain Loc --      Pain Edu? --      Excl. in [place]? --    No data found.  Updated Vital Signs BP 124/87 (BP Location: Right Arm)   Pulse 95   Temp 98.4 F (36.9 C) (Oral)   Resp 18   SpO2 98%   Visual Acuity Right Eye Distance:   Left Eye Distance:   Bilateral Distance:    Right Eye Near:   Left Eye Near:    Bilateral Near:     Physical Exam Vitals and nursing note reviewed.  Constitutional:      General: He is not in acute distress.    Appearance: Normal appearance.  HENT:     Head: Normocephalic.  Eyes:     Extraocular Movements: Extraocular movements intact.     Pupils: Pupils are equal, round, and reactive to light.  Cardiovascular:     Rate and Rhythm: Normal rate and regular rhythm.     Pulses: Normal pulses.     Heart sounds: Normal heart sounds.  Pulmonary:     Effort: Pulmonary effort is normal. No respiratory distress.     Breath sounds: Normal breath sounds. No stridor. No wheezing, rhonchi or rales.  Abdominal:     General: Bowel sounds are normal.     Palpations: Abdomen is soft.     Tenderness: There is no abdominal tenderness.  Musculoskeletal:     Cervical back: Normal range of motion.     Lumbar back: Tenderness present. No swelling, deformity or signs of trauma. Decreased range of motion.  Lymphadenopathy:     Cervical: No cervical adenopathy.  Skin:    General: Skin is warm and dry.  Neurological:     General: No focal deficit present.     Mental Status: He is alert and oriented to person, place, and time.  Psychiatric:        Mood and Affect: Mood normal.        Behavior: Behavior normal.      UC Treatments / Results  Labs (all labs ordered are listed, but only abnormal results are displayed) Labs Reviewed  POCT  URINALYSIS DIP (MANUAL ENTRY) - Abnormal; Notable for the following components:      Result Value   Ketones, POC UA trace (5) (*)    Spec Grav, UA >=  1.030 (*)    All other components within normal limits    EKG   Radiology No results found.  Procedures Procedures (including critical care time)  Medications Ordered in UC Medications  ketorolac (TORADOL) 30 MG/ML injection 30 mg (has no administration in time range)  dexamethasone (DECADRON) injection 10 mg (has no administration in time range)    Initial Impression / Assessment and Plan / UC Course  I have reviewed the triage vital signs and the nursing notes.  Pertinent labs & imaging results that were available during my care of the patient were reviewed by me and considered in my medical decision making (see chart for details).  The patient is well-appearing, he is in no acute distress, vital signs are stable.  Patient with sudden onset of right-sided low back pain.  Pain does not radiate, there is no injury or trauma noted.  No red flag symptoms noted on his exam.  Urinalysis does not show obvious infection, does have elevated specific gravity and ketones.  For patient's right-sided low back pain, will treat with Toradol 30 mg IM and Decadron 10 mg IM.  Patient was advised to monitor his blood sugars as he does have a history of type 2 diabetes.  Patient was prescribed ibuprofen 800 mg to help with pain or discomfort.  Supportive care recommendations were bided to the patient to include staying active, stretching exercises, and increasing his fluid intake.  Patient was advised if symptoms fail to improve, recommend that he follow-up with his primary care physician.  Patient was also given strict ER follow-up precautions.  Patient verbalizes understanding.  All questions were answered.  Patient stable for discharge.   Final Clinical Impressions(s) / UC Diagnoses   Final diagnoses:  Right-sided low back pain without sciatica,  unspecified chronicity     Discharge Instructions      The urinalysis did not indicate any signs of infection. Take medication as prescribed.  Please do not start the ibuprofen until 05/31/2022.  You were given an injection to help with pain that should last you throughout the evening. Increase fluids and allow for plenty of rest. Recommend staying as active as possible at this time.  Providing you stretching exercises that you can do at home to help with your symptoms. Recommend the use of ice or heat as needed.  Apply ice for pain or swelling, heat for spasm or stiffness.  Apply for 20 minutes, remove for 1 hour, then repeat is much as possible. Go to the emergency department immediately if you experience worsening low back pain, become unable to walk, lose control of your bowel or bladder, or have weakness in your lower extremities. If symptoms fail to improve, please follow-up with your primary care physician for further evaluation. Follow-up as needed.     ED Prescriptions     Medication Sig Dispense Auth. Provider   ibuprofen (ADVIL) 800 MG tablet Take 1 tablet (800 mg total) by mouth 3 (three) times daily. 21 tablet Geza Beranek-Warren, Alda Lea, NP      PDMP not reviewed this encounter.   Tish Men, NP 06/05/22 1723

## 2022-06-11 ENCOUNTER — Ambulatory Visit
Admission: EM | Admit: 2022-06-11 | Discharge: 2022-06-11 | Disposition: A | Discharge status: Home / Self Care | Payer: Medicare Other | Attending: Internal Medicine | Admitting: Internal Medicine

## 2022-06-11 DIAGNOSIS — S0502XA Injury of conjunctiva and corneal abrasion without foreign body, left eye, initial encounter: Secondary | ICD-10-CM

## 2022-06-11 MED ORDER — POLYMYXIN B-TRIMETHOPRIM 10000-0.1 UNIT/ML-% OP SOLN: 1.0000 [drp] | Freq: Three times a day (TID) | OPHTHALMIC | 0 refills | Status: AC

## 2022-06-11 NOTE — ED Provider Notes (Signed)
[facility]    CSN: [id number] Arrival date & time: 06/11/22  1307      History   Chief Complaint Chief Complaint  Patient presents with   Eye Problem    HPI Aaron Coffey is a 47 y.o. male presents with his mother who is the historian saying he was cleaning out brush yesterday pm and a branch poked him in his L eye. This am when he got up, his L eye looked red, was not tearing, but he said it was hurting and both lashes were stuck together and both upper lid is a little swollen. He denies photophobia or purulent discharge draining from it.    Past Medical History:  Diagnosis Date   Dystonia    Essential hypertension    Tremor     Patient Active Problem List   Diagnosis Date Noted   ADD (attention deficit disorder) without hyperactivity 05/30/2022   Diabetes mellitus ([place]) 05/30/2022   Hyperlipidemia 05/30/2022   Tremor 05/30/2022   Appendicitis 12/19/2017   Intentional self-harm by knife ([place])    Adjustment disorder with mixed disturbance of emotions and conduct     Past Surgical History:  Procedure Laterality Date   CHOLECYSTECTOMY     LAPAROSCOPIC APPENDECTOMY N/A 12/19/2017   Procedure: APPENDECTOMY LAPAROSCOPIC;  Surgeon: Aviva Signs, MD;  Location: [facility];  Service: General;  Laterality: N/A;       Home Medications    Prior to Admission medications   Medication Sig Start Date End Date Taking? Authorizing Provider  trimethoprim-polymyxin b (POLYTRIM) ophthalmic solution Place 1 drop into both eyes 3 (three) times daily. For 7 days 06/11/22  Yes Rodriguez-Southworth, Sunday Spillers, PA-C  amLODipine (NORVASC) 5 MG tablet Take 1 tablet (5 mg total) by mouth daily. 11/01/18   Noemi Chapel, MD  ibuprofen (ADVIL) 800 MG tablet Take 1 tablet (800 mg total) by mouth 3 (three) times daily. 06/05/22   Leath-Warren, Alda Lea, NP  ipratropium (ATROVENT) 0.03 % nasal spray Place 2 sprays into both nostrils 2 (two) times daily. 03/15/21   Jaynee Eagles, PA-C   JANUMET 50-1000 MG tablet Take 1 tablet by mouth 2 (two) times daily.    [provider]  lisinopril (ZESTRIL) 5 MG tablet Take 5 mg by mouth daily.    [provider]  Omega-3 Fatty Acids (FISH OIL PO) Take 1 capsule by mouth daily.    [provider]  primidone (MYSOLINE) 50 MG tablet Take 100 mg by mouth 2 (two) times daily. 01/10/14   [provider]  simvastatin (ZOCOR) 20 MG tablet Take 20 mg by mouth every evening. 01/10/14   [provider]  trihexyphenidyl (ARTANE) 2 MG tablet Take 1.5 tablets by mouth daily. 10/12/17   [provider]    Family History Family History  Problem Relation Age of Onset   Hypertension Mother     Social History Social History   Tobacco Use   Smoking status: Never   Smokeless tobacco: Never  Vaping Use   Vaping Use: Never used  Substance Use Topics   Alcohol use: Yes    Alcohol/week: 1.0 standard drink of alcohol    Types: 1 Cans of beer per week    Comment: Occasional   Drug use: No     Allergies   Patient has no known allergies.   Review of Systems Review of Systems  Eyes:  Positive for pain and redness. Negative for photophobia, discharge, itching and visual disturbance.     Physical  Exam Triage Vital Signs ED Triage Vitals  Enc Vitals Group     BP 06/11/22 1426 (!) 139/97     Pulse Rate 06/11/22 1426 89     Resp 06/11/22 1426 18     Temp 06/11/22 1426 98.6 F (37 C)     Temp Source 06/11/22 1426 Oral     SpO2 06/11/22 1426 97 %     Weight --      Height --      Head Circumference --      Peak Flow --      Pain Score 06/11/22 1425 5     Pain Loc --      Pain Edu? --      Excl. in [place]? --    No data found.  Updated Vital Signs BP (!) 139/97 (BP Location: Right Arm)   Pulse 89   Temp 98.6 F (37 C) (Oral)   Resp 18   SpO2 97%   Visual Acuity Right Eye Distance:   Left Eye Distance:   Bilateral Distance:    Right Eye Near:   Left Eye Near:     Bilateral Near:     Physical Exam Vitals and nursing note reviewed.  Constitutional:      General: He is not in acute distress.    Appearance: He is obese. He is not toxic-appearing.  Eyes:     General: Lids are normal. Lids are everted, no foreign bodies appreciated. No scleral icterus.       Right eye: No discharge or hordeolum.        Left eye: No foreign body, discharge or hordeolum.     Extraocular Movements: Extraocular movements intact.     Pupils: Pupils are equal, round, and reactive to light.     Left eye: Fluorescein uptake present.      Comments: Has mild injection on medial L conjunctiva His eye lids do not look swollen to me. But both lids are a little pink on the edges.   Musculoskeletal:     Cervical back: Neck supple.  Neurological:     Mental Status: He is alert.      UC Treatments / Results  Labs (all labs ordered are listed, but only abnormal results are displayed) Labs Reviewed - No data to display  EKG   Radiology No results found.  Procedures Procedures (including critical care time)  Medications Ordered in UC Medications - No data to display  Initial Impression / Assessment and Plan / UC Course  I have reviewed the triage vital signs and the nursing notes. L corneal abrasion  He was prescribed Polytrim eye gtts as noted.   Final Clinical Impressions(s) / UC Diagnoses   Final diagnoses:  Corneal abrasion, left, initial encounter   Discharge Instructions   None    ED Prescriptions     Medication Sig Dispense Auth. Provider   trimethoprim-polymyxin b (POLYTRIM) ophthalmic solution Place 1 drop into both eyes 3 (three) times daily. For 7 days 10 mL Rodriguez-Southworth, Sunday Spillers, PA-C      PDMP not reviewed this encounter.   Shelby Mattocks, [place] 06/11/22 1557

## 2022-06-11 NOTE — ED Triage Notes (Signed)
Pt presents with c/o L eye pain.  A stick poked his eye, pt applied systane to the area yesterday and this morning.   Pt states his eye have been runny and presents with puffiness to the left eye.

## 2022-06-13 ENCOUNTER — Telehealth: Payer: Self-pay | Admitting: *Deleted

## 2022-06-13 NOTE — Patient Outreach (Signed)
Brief outreach to advise of planned call tomorrow to discuss [place] again.  Eulah Pont. Myrtie Neither, MSN, [facility] Gerontological Nurse Practitioner [facility] Care Management [phone]

## 2022-06-14 ENCOUNTER — Ambulatory Visit: Payer: Self-pay | Admitting: *Deleted

## 2022-06-14 NOTE — Patient Outreach (Signed)
  Care Coordination  Third outreach for Health Equity Plan  06/14/2022 Name: Aaron Coffey MRN: [id number] DOB: [birth year]   Care Coordination Outreach Attempts:  A third unsuccessful outreach was attempted today to offer the patient with information about available care coordination services as a benefit of their health plan.   Follow Up Plan:  No further outreach attempts will be made at this time. We have been unable to contact the patient to offer or enroll patient in care coordination services  Encounter Outcome:  No Answer   Care Coordination Interventions:  No, not indicated    Lisha Vitale C. Myrtie Neither, MSN, [facility] Gerontological Nurse Practitioner [facility] Care Management [phone]

## 2022-07-16 ENCOUNTER — Encounter: Payer: Self-pay | Admitting: Nurse Practitioner

## 2022-07-16 ENCOUNTER — Ambulatory Visit (INDEPENDENT_AMBULATORY_CARE_PROVIDER_SITE_OTHER): Payer: Medicare Other | Admitting: Nurse Practitioner

## 2022-07-16 VITALS — BP 110/80 | HR 71 | Ht 66.0 in | Wt 201.6 lb

## 2022-07-16 DIAGNOSIS — E1165 Type 2 diabetes mellitus with hyperglycemia: Secondary | ICD-10-CM | POA: Diagnosis not present

## 2022-07-16 LAB — POCT UA - MICROALBUMIN: Creatinine, POC: 300 mg/dL

## 2022-07-16 LAB — POCT GLYCOSYLATED HEMOGLOBIN (HGB A1C): Hemoglobin A1C: 6 % — AB (ref 4.0–5.6)

## 2022-07-16 MED ORDER — JANUMET 50-1000 MG PO TABS: 1.0000 | ORAL_TABLET | Freq: Two times a day (BID) | ORAL | 3 refills | Status: DC

## 2022-07-16 NOTE — Progress Notes (Signed)
Endocrinology Follow Up Note       07/16/2022, 11:13 AM   Subjective:    Patient ID: [id number] SHIPPER, male    DOB: [birth year].  Micheal Likens is being seen in follow up after being seen in consultation for management of currently uncontrolled symptomatic diabetes requested by  Elfredia Nevins, MD.   Past Medical History:  Diagnosis Date   Dystonia    Essential hypertension    Tremor     Past Surgical History:  Procedure Laterality Date   CHOLECYSTECTOMY     LAPAROSCOPIC APPENDECTOMY N/A 12/19/2017   Procedure: APPENDECTOMY LAPAROSCOPIC;  Surgeon: Franky Macho, MD;  Location: [facility];  Service: General;  Laterality: N/A;    Social History   Socioeconomic History   Marital status: Single    Spouse name: Not on file   Number of children: Not on file   Years of education: Not on file   Highest education level: Not on file  Occupational History   Not on file  Tobacco Use   Smoking status: Never   Smokeless tobacco: Never  Vaping Use   Vaping Use: Never used  Substance and Sexual Activity   Alcohol use: Yes    Alcohol/week: 1.0 standard drink of alcohol    Types: 1 Cans of beer per week    Comment: Occasional   Drug use: No   Sexual activity: Never  Other Topics Concern   Not on file  Social History Narrative   Not on file   Social Determinants of Health   Financial Resource Strain: Low Risk  (05/30/2022)   Overall Financial Resource Strain (CARDIA)    Difficulty of Paying Living Expenses: Not very hard  Food Insecurity: No Food Insecurity (05/30/2022)   Hunger Vital Sign    Worried About Running Out of Food in the Last Year: Never true    Ran Out of Food in the Last Year: Never true  Transportation Needs: Not on file  Physical Activity: Inactive (05/30/2022)   Exercise Vital Sign    Days of Exercise per Week: 0 days    Minutes of Exercise per Session: 0 min  Stress: No Stress Concern  Present (05/30/2022)   [employer] of Occupational Health - Occupational Stress Questionnaire    Feeling of Stress : Only a little  Social Connections: Socially Isolated (05/30/2022)   Social Connection and Isolation Panel [NHANES]    Frequency of Communication with Friends and Family: More than three times a week    Frequency of Social Gatherings with Friends and Family: Twice a week    Attends Religious Services: Never    Database administrator or Organizations: No    Attends Banker Meetings: Never    Marital Status: Never married    Family History  Problem Relation Age of Onset   Hypertension Mother     Outpatient Encounter Medications as of 07/16/2022  Medication Sig   amLODipine (NORVASC) 5 MG tablet Take 1 tablet (5 mg total) by mouth daily.   ibuprofen (ADVIL) 800 MG tablet Take 1 tablet (800 mg total) by mouth 3 (three) times daily.   ipratropium (ATROVENT) 0.03 % nasal spray Place 2 sprays  into both nostrils 2 (two) times daily.   lisinopril (ZESTRIL) 5 MG tablet Take 5 mg by mouth daily.   Omega-3 Fatty Acids (FISH OIL PO) Take 1 capsule by mouth daily.   primidone (MYSOLINE) 50 MG tablet Take 100 mg by mouth 2 (two) times daily.   simvastatin (ZOCOR) 20 MG tablet Take 20 mg by mouth every evening.   trihexyphenidyl (ARTANE) 2 MG tablet Take 1.5 tablets by mouth daily.   trimethoprim-polymyxin b (POLYTRIM) ophthalmic solution Place 1 drop into both eyes 3 (three) times daily. For 7 days   [DISCONTINUED] JANUMET 50-1000 MG tablet Take 1 tablet by mouth 2 (two) times daily.   JANUMET 50-1000 MG tablet Take 1 tablet by mouth 2 (two) times daily.   No facility-administered encounter medications on file as of 07/16/2022.    ALLERGIES: No Known Allergies  VACCINATION STATUS: Immunization History  Administered Date(s) Administered   Moderna Sars-Covid-2 Vaccination 06/19/2019, 07/20/2019    Diabetes He presents for his follow-up diabetic visit. He has  type 2 diabetes mellitus. Onset time: Diagnosed at approx age of 16. His disease course has been improving. There are no hypoglycemic associated symptoms. Associated symptoms include weight loss. There are no hypoglycemic complications. There are no diabetic complications. Risk factors for coronary artery disease include diabetes mellitus, family history, obesity, male sex, hypertension, dyslipidemia and sedentary lifestyle. Current diabetic treatment includes oral agent (dual therapy). He is compliant with treatment most of the time. His weight is decreasing steadily. He is following a generally healthy diet. When asked about meal planning, he reported none. He has not had a previous visit with a dietitian. He rarely participates in exercise. His home blood glucose trend is fluctuating minimally. His breakfast blood glucose range is generally 90-110 mg/dl. His bedtime blood glucose range is generally 110-130 mg/dl. (He presents today, accompanied by his mother, with his logs showing inconsistent monitoring but mostly at target glycemic profile.  His POCT A1c today is 6%, unchanged from previous visit of 6%.  He denies any hypoglycemia.  He does note he has started going to the gym.) An ACE inhibitor/angiotensin II receptor blocker is being taken. He does not see a podiatrist.Eye exam is current.     Review of systems  Constitutional: + steadily decreasing body weight, current Body mass index is 32.54 kg/m., no fatigue, no subjective hyperthermia, no subjective hypothermia Eyes: no blurry vision, no xerophthalmia, right eye deviates from midline ENT: no sore throat, no nodules palpated in throat, no dysphagia/odynophagia, no hoarseness Cardiovascular: no chest pain, no shortness of breath, no palpitations, no leg swelling Respiratory: no cough, no shortness of breath Gastrointestinal: no nausea/vomiting/diarrhea Musculoskeletal: no muscle/joint aches Skin: no rashes, no hyperemia Neurological: +  essential tremor, no numbness, no tingling, no dizziness Psychiatric: no depression, no anxiety  Objective:     BP 110/80 (BP Location: Left Arm, Patient Position: Sitting, Cuff Size: Large)   Pulse 71   Ht 5\' 6"  (1.676 m)   Wt 201 lb 9.6 oz (91.4 kg)   BMI 32.54 kg/m   Wt Readings from Last 3 Encounters:  07/16/22 201 lb 9.6 oz (91.4 kg)  04/16/22 206 lb 6.4 oz (93.6 kg)  07/27/19 224 lb (101.6 kg)     BP Readings from Last 3 Encounters:  07/16/22 110/80  06/11/22 (!) 139/97  06/05/22 124/87     Physical Exam- Limited  Constitutional:  Body mass index is 32.54 kg/m. , not in acute distress, normal state of mind, mild cognitive impairment  Eyes:  EOMI, no exophthalmos, right eye deviates from midline Neck: Supple Musculoskeletal: no gross deformities, strength intact in all four extremities, no gross restriction of joint movements Skin:  no rashes, no hyperemia  Diabetic Foot Exam - Simple   Simple Foot Form Diabetic Foot exam was performed with the following findings: Yes 07/16/2022 11:09 AM  Visual Inspection No deformities, no ulcerations, no other skin breakdown bilaterally: Yes Sensation Testing Intact to touch and monofilament testing bilaterally: Yes Pulse Check Posterior Tibialis and Dorsalis pulse intact bilaterally: Yes Comments     CMP ( most recent) CMP     Component Value Date/Time   NA 132 (L) 07/27/2019 2129   K 3.5 07/27/2019 2129   CL 100 07/27/2019 2129   CO2 22 07/27/2019 2129   GLUCOSE 217 (H) 07/27/2019 2129   BUN 10 12/12/2021 0000   CREATININE 0.8 12/12/2021 0000   CREATININE 0.91 07/27/2019 2129   CALCIUM 8.8 (L) 07/27/2019 2129   PROT 7.6 07/27/2019 2129   ALBUMIN 4.2 07/27/2019 2129   AST 17 07/27/2019 2129   ALT 27 07/27/2019 2129   ALKPHOS 72 07/27/2019 2129   BILITOT 0.8 07/27/2019 2129   GFRNONAA >60 07/27/2019 2129   GFRAA >60 07/27/2019 2129     Diabetic Labs (most recent): Lab Results  Component Value Date    HGBA1C 6.0 (A) 07/16/2022   HGBA1C 6.0 (A) 04/16/2022   HGBA1C 15.4 12/12/2021   MICROALBUR 80mg /L 07/16/2022     Lipid Panel ( most recent) Lipid Panel     Component Value Date/Time   TRIG 443 (A) 12/12/2021 0000   LDLCALC 72 12/12/2021 0000      Lab Results  Component Value Date   TSH 2.85 12/12/2021           Assessment & Plan:   1) Type 2 diabetes mellitus with hyperglycemia, without long-term current use of insulin (HCC)  He presents today, accompanied by his mother, with his logs showing inconsistent monitoring but mostly at target glycemic profile.  His POCT A1c today is 6%, unchanged from previous visit of 6%.  He denies any hypoglycemia.  He does note he has started going to the gym.  - YAACOV KOZIOL has currently uncontrolled symptomatic type 2 DM since 47 years of age.   -Recent labs reviewed.  - I had a long discussion with him about the progressive nature of diabetes and the pathology behind its complications. -his diabetes is not currently complicated but he remains at a high risk for more acute and chronic complications which include CAD, CVA, CKD, retinopathy, and neuropathy. These are all discussed in detail with him.  The following Lifestyle Medicine recommendations according to American College of Lifestyle Medicine [facility]) were discussed and offered to patient and he agrees to start the journey:  A. Whole Foods, Plant-based plate comprising of fruits and vegetables, plant-based proteins, whole-grain carbohydrates was discussed in detail with the patient.   A list for source of those nutrients were also provided to the patient.  Patient will use only water or unsweetened tea for hydration. B.  The need to stay away from risky substances including alcohol, smoking; obtaining 7 to 9 hours of restorative sleep, at least 150 minutes of moderate intensity exercise weekly, the importance of healthy social connections,  and stress reduction techniques were  discussed. C.  A full color page of  Calorie density of various food groups per pound showing examples of each food groups was provided to the patient.  -  Nutritional counseling repeated at each appointment due to patients tendency to fall back in to old habits.  - The patient admits there is a room for improvement in their diet and drink choices. -  Suggestion is made for the patient to avoid simple carbohydrates from their diet including Cakes, Sweet Desserts / Pastries, Ice Cream, Soda (diet and regular), Sweet Tea, Candies, Chips, Cookies, Sweet Pastries, Store Bought Juices, Alcohol in Excess of 1-2 drinks a day, Artificial Sweeteners, Coffee Creamer, and "Sugar-free" Products. This will help patient to have stable blood glucose profile and potentially avoid unintended weight gain.   - I encouraged the patient to switch to unprocessed or minimally processed complex starch and increased protein intake (animal or plant source), fruits, and vegetables.   - Patient is advised to stick to a routine mealtimes to eat 3 meals a day and avoid unnecessary snacks (to snack only to correct hypoglycemia).  - I have approached him with the following individualized plan to manage his diabetes and patient agrees:   He will be scheduled with Norm Salt, RDE for diet and diabetes education.  - he is advised to continue Janumet 50/1000 mg po twice daily, therapeutically suitable for patient .  May look at de-escalating treatment regimen at next visit if he continues to maintain such good control.  -he is encouraged to continue monitoring glucose at least once daily 3 days per week, before breakfast and to call the clinic if he has readings less than 70 or above 200 for 3 tests in a row.  - Adjustment parameters are given to him for hypo and hyperglycemia in writing.  - Specific targets for  A1c; LDL, HDL, and Triglycerides were discussed with the patient.  2) Blood Pressure /Hypertension:  his blood  pressure is controlled to target.   he is advised to continue his current medications including Lisinopril 5 mg p.o. daily with breakfast, and Norvasc 5 mg po daily.Marland Kitchen  3) Lipids/Hyperlipidemia:    Review of his recent lipid panel from 12/12/21 showed controlled LDL at 72 and elevated triglycerides of 443 .  he is advised to continue Simvastatin 20 mg daily at bedtime.  Side effects and precautions discussed with him.  4)  Weight/Diet:  his Body mass index is 32.54 kg/m.  -  clearly complicating his diabetes care.   he is a candidate for weight loss. I discussed with him the fact that loss of 5 - 10% of his  current body weight will have the most impact on his diabetes management.  Exercise, and detailed carbohydrates information provided  -  detailed on discharge instructions.  5) Chronic Care/Health Maintenance: -he is on ACEI/ARB and Statin medications and is encouraged to initiate and continue to follow up with Ophthalmology, Dentist, Podiatrist at least yearly or according to recommendations, and advised to stay away from smoking. I have recommended yearly flu vaccine and pneumonia vaccine at least every 5 years; moderate intensity exercise for up to 150 minutes weekly; and sleep for at least 7 hours a day.  - he is advised to maintain close follow up with Elfredia Nevins, MD for primary care needs, as well as his other providers for optimal and coordinated care.     I spent  30  minutes in the care of the patient today including review of labs from CMP, Lipids, Thyroid Function, Hematology (current and previous including abstractions from other facilities); face-to-face time discussing  his blood glucose readings/logs, discussing hypoglycemia and hyperglycemia episodes and symptoms, medications  doses, his options of short and long term treatment based on the latest standards of care / guidelines;  discussion about incorporating lifestyle medicine;  and documenting the encounter. Risk reduction  counseling performed per USPSTF guidelines to reduce obesity and cardiovascular risk factors.     Please refer to Patient Instructions for Blood Glucose Monitoring and Insulin/Medications Dosing Guide"  in media tab for additional information. Please  also refer to " Patient Self Inventory" in the Media  tab for reviewed elements of pertinent patient history.  Micheal Likens participated in the discussions, expressed understanding, and voiced agreement with the above plans.  All questions were answered to his satisfaction. he is encouraged to contact clinic should he have any questions or concerns prior to his return visit.     Follow up plan: - Return in about 4 months (around 11/15/2022) for Diabetes F/U with A1c in office, No previsit labs, Bring meter and logs.   Ronny Bacon, [facility] [address] [place], [place] [address] Phone: [phone] Fax: [phone]  07/16/2022, 11:13 AM

## 2022-07-16 NOTE — Patient Instructions (Signed)

## 2022-09-06 DIAGNOSIS — G249 Dystonia, unspecified: Secondary | ICD-10-CM | POA: Diagnosis not present

## 2022-09-06 DIAGNOSIS — G2589 Other specified extrapyramidal and movement disorders: Secondary | ICD-10-CM | POA: Diagnosis not present

## 2022-11-15 ENCOUNTER — Encounter: Payer: Self-pay | Admitting: Nurse Practitioner

## 2022-11-15 ENCOUNTER — Ambulatory Visit (INDEPENDENT_AMBULATORY_CARE_PROVIDER_SITE_OTHER): Payer: Medicare Other | Admitting: Nurse Practitioner

## 2022-11-15 VITALS — BP 128/85 | HR 76 | Ht 66.0 in | Wt 204.4 lb

## 2022-11-15 DIAGNOSIS — E1165 Type 2 diabetes mellitus with hyperglycemia: Secondary | ICD-10-CM | POA: Diagnosis not present

## 2022-11-15 DIAGNOSIS — E782 Mixed hyperlipidemia: Secondary | ICD-10-CM | POA: Diagnosis not present

## 2022-11-15 DIAGNOSIS — Z7984 Long term (current) use of oral hypoglycemic drugs: Secondary | ICD-10-CM

## 2022-11-15 LAB — POCT GLYCOSYLATED HEMOGLOBIN (HGB A1C): Hemoglobin A1C: 5.6 % (ref 4.0–5.6)

## 2022-11-15 NOTE — Progress Notes (Signed)
Endocrinology Follow Up Note       11/15/2022, 10:43 AM   Subjective:    Patient ID: Aaron Coffey, male    DOB: 1976-01-29.  Aaron Coffey is being seen in follow up after being seen in consultation for management of currently uncontrolled symptomatic diabetes requested by  Elfredia Nevins, MD.   Past Medical History:  Diagnosis Date   Dystonia    Essential hypertension    Tremor     Past Surgical History:  Procedure Laterality Date   CHOLECYSTECTOMY     LAPAROSCOPIC APPENDECTOMY N/A 12/19/2017   Procedure: APPENDECTOMY LAPAROSCOPIC;  Surgeon: Franky Macho, MD;  Location: [facility];  Service: General;  Laterality: N/A;    Social History   Socioeconomic History   Marital status: Single    Spouse name: Not on file   Number of children: Not on file   Years of education: Not on file   Highest education level: Not on file  Occupational History   Not on file  Tobacco Use   Smoking status: Never   Smokeless tobacco: Never  Vaping Use   Vaping status: Never Used  Substance and Sexual Activity   Alcohol use: Yes    Alcohol/week: 1.0 standard drink of alcohol    Types: 1 Cans of beer per week    Comment: Occasional   Drug use: No   Sexual activity: Never  Other Topics Concern   Not on file  Social History Narrative   Not on file   Social Determinants of Health   Financial Resource Strain: Low Risk  (05/30/2022)   Overall Financial Resource Strain (CARDIA)    Difficulty of Paying Living Expenses: Not very hard  Food Insecurity: No Food Insecurity (05/30/2022)   Hunger Vital Sign    Worried About Running Out of Food in the Last Year: Never true    Ran Out of Food in the Last Year: Never true  Transportation Needs: Not on file  Physical Activity: Inactive (05/30/2022)   Exercise Vital Sign    Days of Exercise per Week: 0 days    Minutes of Exercise per Session: 0 min  Stress: No Stress  Concern Present (05/30/2022)   [employer] of Occupational Health - Occupational Stress Questionnaire    Feeling of Stress : Only a little  Social Connections: Socially Isolated (05/30/2022)   Social Connection and Isolation Panel [NHANES]    Frequency of Communication with Friends and Family: More than three times a week    Frequency of Social Gatherings with Friends and Family: Twice a week    Attends Religious Services: Never    Database administrator or Organizations: No    Attends Banker Meetings: Never    Marital Status: Never married    Family History  Problem Relation Age of Onset   Hypertension Mother     Outpatient Encounter Medications as of 11/15/2022  Medication Sig   amLODipine (NORVASC) 5 MG tablet Take 1 tablet (5 mg total) by mouth daily.   ibuprofen (ADVIL) 800 MG tablet Take 1 tablet (800 mg total) by mouth 3 (three) times daily.   ipratropium (ATROVENT) 0.03 % nasal spray Place 2 sprays  into both nostrils 2 (two) times daily.   JANUMET 50-1000 MG tablet Take 1 tablet by mouth 2 (two) times daily.   lisinopril (ZESTRIL) 5 MG tablet Take 5 mg by mouth daily.   Omega-3 Fatty Acids (FISH OIL PO) Take 1 capsule by mouth daily.   primidone (MYSOLINE) 50 MG tablet Take 100 mg by mouth 2 (two) times daily.   simvastatin (ZOCOR) 20 MG tablet Take 20 mg by mouth every evening.   trihexyphenidyl (ARTANE) 2 MG tablet Take 1.5 tablets by mouth daily.   trimethoprim-polymyxin b (POLYTRIM) ophthalmic solution Place 1 drop into both eyes 3 (three) times daily. For 7 days   No facility-administered encounter medications on file as of 11/15/2022.    ALLERGIES: No Known Allergies  VACCINATION STATUS: Immunization History  Administered Date(s) Administered   Moderna Sars-Covid-2 Vaccination 06/19/2019, 07/20/2019    Diabetes He presents for his follow-up diabetic visit. He has type 2 diabetes mellitus. Onset time: Diagnosed at approx age of 3. His disease  course has been improving. There are no hypoglycemic associated symptoms. There are no diabetic associated symptoms. Pertinent negatives for diabetes include no weight loss. There are no hypoglycemic complications. There are no diabetic complications. Risk factors for coronary artery disease include diabetes mellitus, family history, obesity, male sex, hypertension, dyslipidemia and sedentary lifestyle. Current diabetic treatment includes oral agent (dual therapy). He is compliant with treatment most of the time. His weight is decreasing steadily. He is following a generally healthy diet. When asked about meal planning, he reported none. He has not had a previous visit with a dietitian. He rarely participates in exercise. His home blood glucose trend is fluctuating minimally. His breakfast blood glucose range is generally 90-110 mg/dl. (He presents today, accompanied by his mother, with his logs showing inconsistent monitoring but mostly at target glycemic profile.  His POCT A1c today is 5.6%, improving from last visit of 6%.  He denies any hypoglycemia.  He does note he has continues going to the gym.) An ACE inhibitor/angiotensin II receptor blocker is being taken. He does not see a podiatrist.Eye exam is current.     Review of systems  Constitutional: + stable body weight, current Body mass index is 32.99 kg/m., no fatigue, no subjective hyperthermia, no subjective hypothermia Eyes: no blurry vision, no xerophthalmia, right eye deviates from midline ENT: no sore throat, no nodules palpated in throat, no dysphagia/odynophagia, no hoarseness Cardiovascular: no chest pain, no shortness of breath, no palpitations, no leg swelling Respiratory: no cough, no shortness of breath Gastrointestinal: no nausea/vomiting/diarrhea Musculoskeletal: no muscle/joint aches Skin: no rashes, no hyperemia Neurological: + essential tremor, no numbness, no tingling, no dizziness Psychiatric: no depression, no  anxiety  Objective:     BP 128/85 (BP Location: Left Arm, Patient Position: Sitting, Cuff Size: Large)   Pulse 76   Ht 5\' 6"  (1.676 m)   Wt 204 lb 6.4 oz (92.7 kg)   BMI 32.99 kg/m   Wt Readings from Last 3 Encounters:  11/15/22 204 lb 6.4 oz (92.7 kg)  07/16/22 201 lb 9.6 oz (91.4 kg)  04/16/22 206 lb 6.4 oz (93.6 kg)     BP Readings from Last 3 Encounters:  11/15/22 128/85  07/16/22 110/80  06/11/22 (!) 139/97     Physical Exam- Limited  Constitutional:  Body mass index is 32.99 kg/m. , not in acute distress, normal state of mind, mild cognitive impairment Eyes:  EOMI, no exophthalmos, right eye deviates from midline Neck: Supple Musculoskeletal: no gross  deformities, strength intact in all four extremities, no gross restriction of joint movements Skin:  no rashes, no hyperemia  Diabetic Foot Exam - Simple   No data filed     CMP ( most recent) CMP     Component Value Date/Time   NA 132 (L) 07/27/2019 2129   K 3.5 07/27/2019 2129   CL 100 07/27/2019 2129   CO2 22 07/27/2019 2129   GLUCOSE 217 (H) 07/27/2019 2129   BUN 10 12/12/2021 0000   CREATININE 0.8 12/12/2021 0000   CREATININE 0.91 07/27/2019 2129   CALCIUM 8.8 (L) 07/27/2019 2129   PROT 7.6 07/27/2019 2129   ALBUMIN 4.2 07/27/2019 2129   AST 17 07/27/2019 2129   ALT 27 07/27/2019 2129   ALKPHOS 72 07/27/2019 2129   BILITOT 0.8 07/27/2019 2129   GFRNONAA >60 07/27/2019 2129   GFRAA >60 07/27/2019 2129     Diabetic Labs (most recent): Lab Results  Component Value Date   HGBA1C 5.6 11/15/2022   HGBA1C 6.0 (A) 07/16/2022   HGBA1C 6.0 (A) 04/16/2022   MICROALBUR 80mg /L 07/16/2022     Lipid Panel ( most recent) Lipid Panel     Component Value Date/Time   TRIG 443 (A) 12/12/2021 0000   LDLCALC 72 12/12/2021 0000      Lab Results  Component Value Date   TSH 2.85 12/12/2021           Assessment & Plan:   1) Type 2 diabetes mellitus with hyperglycemia, without long-term current  use of insulin (HCC)  He presents today, accompanied by his mother, with his logs showing inconsistent monitoring but mostly at target glycemic profile.  His POCT A1c today is 5.6%, improving from last visit of 6%.  He denies any hypoglycemia.  He does note he has continues going to the gym.  - Aaron Coffey has currently uncontrolled symptomatic type 2 DM since 47 years of age.   -Recent labs reviewed.  - I had a long discussion with him about the progressive nature of diabetes and the pathology behind its complications. -his diabetes is not currently complicated but he remains at a high risk for more acute and chronic complications which include CAD, CVA, CKD, retinopathy, and neuropathy. These are all discussed in detail with him.  The following Lifestyle Medicine recommendations according to American College of Lifestyle Medicine [facility]) were discussed and offered to patient and he agrees to start the journey:  A. Whole Foods, Plant-based plate comprising of fruits and vegetables, plant-based proteins, whole-grain carbohydrates was discussed in detail with the patient.   A list for source of those nutrients were also provided to the patient.  Patient will use only water or unsweetened tea for hydration. B.  The need to stay away from risky substances including alcohol, smoking; obtaining 7 to 9 hours of restorative sleep, at least 150 minutes of moderate intensity exercise weekly, the importance of healthy social connections,  and stress reduction techniques were discussed. C.  A full color page of  Calorie density of various food groups per pound showing examples of each food groups was provided to the patient.  - Nutritional counseling repeated at each appointment due to patients tendency to fall back in to old habits.  - The patient admits there is a room for improvement in their diet and drink choices. -  Suggestion is made for the patient to avoid simple carbohydrates from their diet  including Cakes, Sweet Desserts / Pastries, Ice Cream, Soda (diet and regular),  Sweet Tea, Candies, Chips, Cookies, Sweet Pastries, Store Bought Juices, Alcohol in Excess of 1-2 drinks a day, Artificial Sweeteners, Coffee Creamer, and "Sugar-free" Products. This will help patient to have stable blood glucose profile and potentially avoid unintended weight gain.   - I encouraged the patient to switch to unprocessed or minimally processed complex starch and increased protein intake (animal or plant source), fruits, and vegetables.   - Patient is advised to stick to a routine mealtimes to eat 3 meals a day and avoid unnecessary snacks (to snack only to correct hypoglycemia).  - I have approached him with the following individualized plan to manage his diabetes and patient agrees:   He will be scheduled with Norm Salt, RDE for diet and diabetes education.  - he is advised to lower his Janumet to half tab of 50/1000 mg po twice daily, therapeutically suitable for patient.    -he is encouraged to continue monitoring glucose at least once daily 2-3 days per week, before breakfast and to call the clinic if he has readings less than 70 or above 200 for 3 tests in a row.  - Adjustment parameters are given to him for hypo and hyperglycemia in writing.  - Specific targets for  A1c; LDL, HDL, and Triglycerides were discussed with the patient.  2) Blood Pressure /Hypertension:  his blood pressure is controlled to target.   he is advised to continue his current medications including Lisinopril 5 mg p.o. daily with breakfast, and Norvasc 5 mg po daily.Marland Kitchen  3) Lipids/Hyperlipidemia:    Review of his recent lipid panel from 12/12/21 showed controlled LDL at 72 and elevated triglycerides of 443 .  he is advised to continue Simvastatin 20 mg daily at bedtime.  Side effects and precautions discussed with him.  4)  Weight/Diet:  his Body mass index is 32.99 kg/m.  -  clearly complicating his diabetes care.    he is a candidate for weight loss. I discussed with him the fact that loss of 5 - 10% of his  current body weight will have the most impact on his diabetes management.  Exercise, and detailed carbohydrates information provided  -  detailed on discharge instructions.  5) Chronic Care/Health Maintenance: -he is on ACEI/ARB and Statin medications and is encouraged to initiate and continue to follow up with Ophthalmology, Dentist, Podiatrist at least yearly or according to recommendations, and advised to stay away from smoking. I have recommended yearly flu vaccine and pneumonia vaccine at least every 5 years; moderate intensity exercise for up to 150 minutes weekly; and sleep for at least 7 hours a day.  - he is advised to maintain close follow up with Elfredia Nevins, MD for primary care needs, as well as his other providers for optimal and coordinated care.      I spent  25  minutes in the care of the patient today including review of labs from CMP, Lipids, Thyroid Function, Hematology (current and previous including abstractions from other facilities); face-to-face time discussing  his blood glucose readings/logs, discussing hypoglycemia and hyperglycemia episodes and symptoms, medications doses, his options of short and long term treatment based on the latest standards of care / guidelines;  discussion about incorporating lifestyle medicine;  and documenting the encounter. Risk reduction counseling performed per USPSTF guidelines to reduce obesity and cardiovascular risk factors.     Please refer to Patient Instructions for Blood Glucose Monitoring and Insulin/Medications Dosing Guide"  in media tab for additional information. Please  also refer to "  Patient Self Inventory" in the Media  tab for reviewed elements of pertinent patient history.  Aaron Coffey participated in the discussions, expressed understanding, and voiced agreement with the above plans.  All questions were answered to his  satisfaction. he is encouraged to contact clinic should he have any questions or concerns prior to his return visit.     Follow up plan: - Return in about 6 months (around 05/18/2023) for Diabetes F/U with A1c in office, No previsit labs.   Ronny Bacon, [facility] [address] [place], [place] [address] Phone: [phone] Fax: [phone]  11/15/2022, 10:43 AM

## 2022-12-20 DIAGNOSIS — G252 Other specified forms of tremor: Secondary | ICD-10-CM | POA: Diagnosis not present

## 2022-12-20 DIAGNOSIS — G249 Dystonia, unspecified: Secondary | ICD-10-CM | POA: Diagnosis not present

## 2022-12-20 DIAGNOSIS — G2589 Other specified extrapyramidal and movement disorders: Secondary | ICD-10-CM | POA: Diagnosis not present

## 2023-03-21 DIAGNOSIS — G249 Dystonia, unspecified: Secondary | ICD-10-CM | POA: Diagnosis not present

## 2023-03-21 DIAGNOSIS — G2589 Other specified extrapyramidal and movement disorders: Secondary | ICD-10-CM | POA: Diagnosis not present

## 2023-04-01 DIAGNOSIS — Z125 Encounter for screening for malignant neoplasm of prostate: Secondary | ICD-10-CM | POA: Diagnosis not present

## 2023-04-01 DIAGNOSIS — Z1331 Encounter for screening for depression: Secondary | ICD-10-CM | POA: Diagnosis not present

## 2023-04-01 DIAGNOSIS — I10 Essential (primary) hypertension: Secondary | ICD-10-CM | POA: Diagnosis not present

## 2023-04-01 DIAGNOSIS — E782 Mixed hyperlipidemia: Secondary | ICD-10-CM | POA: Diagnosis not present

## 2023-04-01 DIAGNOSIS — E1165 Type 2 diabetes mellitus with hyperglycemia: Secondary | ICD-10-CM | POA: Diagnosis not present

## 2023-04-01 DIAGNOSIS — J069 Acute upper respiratory infection, unspecified: Secondary | ICD-10-CM | POA: Diagnosis not present

## 2023-04-01 DIAGNOSIS — F909 Attention-deficit hyperactivity disorder, unspecified type: Secondary | ICD-10-CM | POA: Diagnosis not present

## 2023-04-01 DIAGNOSIS — E663 Overweight: Secondary | ICD-10-CM | POA: Diagnosis not present

## 2023-04-01 DIAGNOSIS — Z6829 Body mass index (BMI) 29.0-29.9, adult: Secondary | ICD-10-CM | POA: Diagnosis not present

## 2023-04-01 DIAGNOSIS — Z0000 Encounter for general adult medical examination without abnormal findings: Secondary | ICD-10-CM | POA: Diagnosis not present

## 2023-04-01 DIAGNOSIS — G250 Essential tremor: Secondary | ICD-10-CM | POA: Diagnosis not present

## 2023-04-08 DIAGNOSIS — Z23 Encounter for immunization: Secondary | ICD-10-CM | POA: Diagnosis not present

## 2023-04-13 ENCOUNTER — Ambulatory Visit
Admission: EM | Admit: 2023-04-13 | Discharge: 2023-04-13 | Disposition: A | Discharge status: Home / Self Care | Payer: Medicare Other | Attending: Family Medicine | Admitting: Family Medicine

## 2023-04-13 ENCOUNTER — Other Ambulatory Visit: Payer: Self-pay

## 2023-04-13 DIAGNOSIS — R21 Rash and other nonspecific skin eruption: Secondary | ICD-10-CM

## 2023-04-13 MED ORDER — TRIAMCINOLONE ACETONIDE 0.1 % EX CREA: 1.0000 | TOPICAL_CREAM | Freq: Two times a day (BID) | CUTANEOUS | 0 refills | Status: DC

## 2023-04-13 MED ORDER — METHYLPREDNISOLONE SODIUM SUCC 125 MG IJ SOLR
125.0000 mg | Freq: Once | INTRAMUSCULAR | Status: AC
  Administered 2023-04-13: 125 mg via INTRAMUSCULAR

## 2023-04-13 NOTE — ED Triage Notes (Addendum)
 Pt reports generalized rash and intermittent headache since yesterday.    Started Clindamycin for wisdom teeth and recently changed bp medicine from lisinopril to benicar on 1/2.

## 2023-04-13 NOTE — ED Provider Notes (Signed)
 [facility]    CSN: [id number] Arrival date & time: 04/13/23  9167      History   Chief Complaint Chief Complaint  Patient presents with   Rash    HPI Aaron Coffey is a 48 y.o. male.   Presenting today with an itchy rash across entire body that he first noticed yesterday.  Denies associated fever, chills, body aches, chest tightness, shortness of breath, throat itching or swelling, nausea, vomiting, diaphoresis.  Started clindamycin for a dental issue and switch from lisinopril to Benicar while on 04-03-23 but has not had any issues with these throughout taking them thus far.  So far tried calamine lotion with no relief.    Past Medical History:  Diagnosis Date   Dystonia    Essential hypertension    Tremor     Patient Active Problem List   Diagnosis Date Noted   ADD (attention deficit disorder) without hyperactivity 05/30/2022   Diabetes mellitus (HCC) 05/30/2022   Hyperlipidemia 05/30/2022   Tremor 05/30/2022   Appendicitis 12/19/2017   Intentional self-harm by knife (HCC)    Adjustment disorder with mixed disturbance of emotions and conduct     Past Surgical History:  Procedure Laterality Date   CHOLECYSTECTOMY     LAPAROSCOPIC APPENDECTOMY N/A 12/19/2017   Procedure: APPENDECTOMY LAPAROSCOPIC;  Surgeon: Mavis Anes, MD;  Location: [facility];  Service: General;  Laterality: N/A;       Home Medications    Prior to Admission medications   Medication Sig Start Date End Date Taking? Authorizing Provider  triamcinolone  cream (KENALOG ) 0.1 % Apply 1 Application topically 2 (two) times daily. Avoid use on face or private areas 04/13/23  Yes Stuart Vernell Norris, PA-C  amLODipine  (NORVASC ) 5 MG tablet Take 1 tablet (5 mg total) by mouth daily. 11/01/18   Cleotilde Rogue, MD  ibuprofen  (ADVIL ) 800 MG tablet Take 1 tablet (800 mg total) by mouth 3 (three) times daily. 06/05/22   Leath-Warren, Etta PARAS, NP  ipratropium (ATROVENT ) 0.03 % nasal spray Place 2  sprays into both nostrils 2 (two) times daily. 03/15/21   Christopher Savannah, PA-C  JANUMET  50-1000 MG tablet Take 1 tablet by mouth 2 (two) times daily. 07/16/22   Therisa Benton PARAS, NP  lisinopril (ZESTRIL) 5 MG tablet Take 5 mg by mouth daily.    [provider]  Omega-3 Fatty Acids (FISH OIL PO) Take 1 capsule by mouth daily.    [provider]  primidone  (MYSOLINE ) 50 MG tablet Take 100 mg by mouth 2 (two) times daily. 01/10/14   [provider]  simvastatin  (ZOCOR ) 20 MG tablet Take 20 mg by mouth every evening. 01/10/14   [provider]  trihexyphenidyl  (ARTANE ) 2 MG tablet Take 1.5 tablets by mouth daily. 10/12/17   [provider]  trimethoprim -polymyxin b  (POLYTRIM ) ophthalmic solution Place 1 drop into both eyes 3 (three) times daily. For 7 days 06/11/22   Rodriguez-Southworth, Kyra, PA-C    Family History Family History  Problem Relation Age of Onset   Hypertension Mother     Social History Social History   Tobacco Use   Smoking status: Never   Smokeless tobacco: Never  Vaping Use   Vaping status: Never Used  Substance Use Topics   Alcohol use: Yes    Alcohol/week: 1.0 standard drink of alcohol    Types: 1 Cans of beer per week    Comment: Occasional   Drug use: No     Allergies   Patient  has no known allergies.   Review of Systems Review of Systems Per HPI  Physical Exam Triage Vital Signs ED Triage Vitals [04/13/23 0839]  Encounter Vitals Group     BP (!) 137/91     Systolic BP Percentile      Diastolic BP Percentile      Pulse Rate 89     Resp 20     Temp 98.2 F (36.8 C)     Temp Source Oral     SpO2 98 %     Weight      Height      Head Circumference      Peak Flow      Pain Score 2     Pain Loc      Pain Education      Exclude from Growth Chart    No data found.  Updated Vital Signs BP (!) 137/91 (BP Location: Right Arm)   Pulse 89   Temp 98.2 F (36.8 C) (Oral)   Resp 20   SpO2 98%    Visual Acuity Right Eye Distance:   Left Eye Distance:   Bilateral Distance:    Right Eye Near:   Left Eye Near:    Bilateral Near:     Physical Exam Vitals and nursing note reviewed.  Constitutional:      Appearance: Normal appearance.  HENT:     Head: Atraumatic.  Eyes:     Extraocular Movements: Extraocular movements intact.     Conjunctiva/sclera: Conjunctivae normal.  Cardiovascular:     Rate and Rhythm: Normal rate and regular rhythm.  Pulmonary:     Effort: Pulmonary effort is normal.     Breath sounds: Normal breath sounds.  Musculoskeletal:        General: Normal range of motion.     Cervical back: Normal range of motion and neck supple.  Skin:    General: Skin is warm and dry.     Findings: Rash present.     Comments: Widespread erythematous papular rash across entire body  Neurological:     General: No focal deficit present.     Mental Status: He is oriented to person, place, and time.     Motor: No weakness.     Gait: Gait normal.  Psychiatric:        Mood and Affect: Mood normal.        Thought Content: Thought content normal.        Judgment: Judgment normal.      UC Treatments / Results  Labs (all labs ordered are listed, but only abnormal results are displayed) Labs Reviewed - No data to display  EKG   Radiology No results found.  Procedures Procedures (including critical care time)  Medications Ordered in UC Medications  methylPREDNISolone  sodium succinate (SOLU-MEDROL ) 125 mg/2 mL injection 125 mg (125 mg Intramuscular Given 04/13/23 0930)    Initial Impression / Assessment and Plan / UC Course  I have reviewed the triage vital signs and the nursing notes.  Pertinent labs & imaging results that were available during my care of the patient were reviewed by me and considered in my medical decision making (see chart for details).     Suspected allergic dermatitis, unclear etiology.  Low suspicion for medication reaction at this  time.  Treat with IM Solu-Medrol , triamcinolone , antihistamines and PCP follow-up particularly if not resolving.  Final Clinical Impressions(s) / UC Diagnoses   Final diagnoses:  Rash   Discharge Instructions   None  ED Prescriptions     Medication Sig Dispense Auth. Provider   triamcinolone  cream (KENALOG ) 0.1 % Apply 1 Application topically 2 (two) times daily. Avoid use on face or private areas 80 g Stuart Vernell Norris, [place]      PDMP not reviewed this encounter.   Stuart Vernell Norris, [place] 04/13/23 [phone]

## 2023-04-14 DIAGNOSIS — E1165 Type 2 diabetes mellitus with hyperglycemia: Secondary | ICD-10-CM | POA: Diagnosis not present

## 2023-04-14 DIAGNOSIS — I10 Essential (primary) hypertension: Secondary | ICD-10-CM | POA: Diagnosis not present

## 2023-04-14 DIAGNOSIS — E6609 Other obesity due to excess calories: Secondary | ICD-10-CM | POA: Diagnosis not present

## 2023-04-14 DIAGNOSIS — Z6829 Body mass index (BMI) 29.0-29.9, adult: Secondary | ICD-10-CM | POA: Diagnosis not present

## 2023-04-14 DIAGNOSIS — L508 Other urticaria: Secondary | ICD-10-CM | POA: Diagnosis not present

## 2023-05-01 DIAGNOSIS — H52223 Regular astigmatism, bilateral: Secondary | ICD-10-CM | POA: Diagnosis not present

## 2023-05-01 DIAGNOSIS — H5203 Hypermetropia, bilateral: Secondary | ICD-10-CM | POA: Diagnosis not present

## 2023-05-01 DIAGNOSIS — E119 Type 2 diabetes mellitus without complications: Secondary | ICD-10-CM | POA: Diagnosis not present

## 2023-05-01 DIAGNOSIS — H5015 Alternating exotropia: Secondary | ICD-10-CM | POA: Diagnosis not present

## 2023-05-01 DIAGNOSIS — H524 Presbyopia: Secondary | ICD-10-CM | POA: Diagnosis not present

## 2023-05-08 DIAGNOSIS — G249 Dystonia, unspecified: Secondary | ICD-10-CM | POA: Diagnosis not present

## 2023-05-08 DIAGNOSIS — Z79899 Other long term (current) drug therapy: Secondary | ICD-10-CM | POA: Diagnosis not present

## 2023-05-08 DIAGNOSIS — R251 Tremor, unspecified: Secondary | ICD-10-CM | POA: Diagnosis not present

## 2023-05-21 ENCOUNTER — Ambulatory Visit: Payer: Medicare Other | Admitting: Nurse Practitioner

## 2023-06-02 ENCOUNTER — Ambulatory Visit (INDEPENDENT_AMBULATORY_CARE_PROVIDER_SITE_OTHER): Payer: Medicare Other | Admitting: Nurse Practitioner

## 2023-06-02 ENCOUNTER — Encounter: Payer: Self-pay | Admitting: Nurse Practitioner

## 2023-06-02 VITALS — BP 110/80 | HR 75 | Ht 66.0 in | Wt 200.0 lb

## 2023-06-02 DIAGNOSIS — Z7984 Long term (current) use of oral hypoglycemic drugs: Secondary | ICD-10-CM

## 2023-06-02 DIAGNOSIS — E782 Mixed hyperlipidemia: Secondary | ICD-10-CM | POA: Diagnosis not present

## 2023-06-02 DIAGNOSIS — E1165 Type 2 diabetes mellitus with hyperglycemia: Secondary | ICD-10-CM | POA: Diagnosis not present

## 2023-06-02 LAB — POCT GLYCOSYLATED HEMOGLOBIN (HGB A1C): Hemoglobin A1C: 5.6 % (ref 4.0–5.6)

## 2023-06-02 MED ORDER — JANUMET 50-1000 MG PO TABS: 0.5000 | ORAL_TABLET | Freq: Two times a day (BID) | ORAL | 3 refills | Status: DC

## 2023-06-02 NOTE — Progress Notes (Signed)
 Endocrinology Follow Up Note       06/02/2023, 11:19 AM   Subjective:    Patient ID: Aaron Coffey, male    DOB: 02/26/1976.  Aaron Coffey is being seen in follow up after being seen in consultation for management of currently uncontrolled symptomatic diabetes requested by  Elfredia Nevins, MD.   Past Medical History:  Diagnosis Date   Dystonia    Essential hypertension    Tremor     Past Surgical History:  Procedure Laterality Date   CHOLECYSTECTOMY     LAPAROSCOPIC APPENDECTOMY N/A 12/19/2017   Procedure: APPENDECTOMY LAPAROSCOPIC;  Surgeon: Franky Macho, MD;  Location: [facility];  Service: General;  Laterality: N/A;    Social History   Socioeconomic History   Marital status: Single    Spouse name: Not on file   Number of children: Not on file   Years of education: Not on file   Highest education level: Not on file  Occupational History   Not on file  Tobacco Use   Smoking status: Never   Smokeless tobacco: Never  Vaping Use   Vaping status: Never Used  Substance and Sexual Activity   Alcohol use: Yes    Alcohol/week: 1.0 standard drink of alcohol    Types: 1 Cans of beer per week    Comment: Occasional   Drug use: No   Sexual activity: Never  Other Topics Concern   Not on file  Social History Narrative   Not on file   Social Drivers of Health   Financial Resource Strain: Low Risk  (05/30/2022)   Overall Financial Resource Strain (CARDIA)    Difficulty of Paying Living Expenses: Not very hard  Food Insecurity: No Food Insecurity (05/30/2022)   Hunger Vital Sign    Worried About Running Out of Food in the Last Year: Never true    Ran Out of Food in the Last Year: Never true  Transportation Needs: Not on file  Physical Activity: Inactive (05/30/2022)   Exercise Vital Sign    Days of Exercise per Week: 0 days    Minutes of Exercise per Session: 0 min  Stress: No Stress Concern  Present (05/30/2022)   [employer] of Occupational Health - Occupational Stress Questionnaire    Feeling of Stress : Only a little  Social Connections: Socially Isolated (05/30/2022)   Social Connection and Isolation Panel [NHANES]    Frequency of Communication with Friends and Family: More than three times a week    Frequency of Social Gatherings with Friends and Family: Twice a week    Attends Religious Services: Never    Database administrator or Organizations: No    Attends Banker Meetings: Never    Marital Status: Never married    Family History  Problem Relation Age of Onset   Hypertension Mother     Outpatient Encounter Medications as of 06/02/2023  Medication Sig   amLODipine (NORVASC) 5 MG tablet Take 1 tablet (5 mg total) by mouth daily.   ibuprofen (ADVIL) 800 MG tablet Take 1 tablet (800 mg total) by mouth 3 (three) times daily.   ipratropium (ATROVENT) 0.03 % nasal spray Place 2 sprays  into both nostrils 2 (two) times daily.   lisinopril (ZESTRIL) 10 MG tablet Take 10 mg by mouth daily.   Omega-3 Fatty Acids (FISH OIL PO) Take 1 capsule by mouth daily.   primidone (MYSOLINE) 50 MG tablet Take 100 mg by mouth 2 (two) times daily.   simvastatin (ZOCOR) 20 MG tablet Take 20 mg by mouth every evening.   triamcinolone cream (KENALOG) 0.1 % Apply 1 Application topically 2 (two) times daily. Avoid use on face or private areas   trihexyphenidyl (ARTANE) 2 MG tablet Take 1.5 tablets by mouth daily.   trimethoprim-polymyxin b (POLYTRIM) ophthalmic solution Place 1 drop into both eyes 3 (three) times daily. For 7 days   [DISCONTINUED] JANUMET 50-1000 MG tablet Take 1 tablet by mouth 2 (two) times daily.   JANUMET 50-1000 MG tablet Take 0.5 tablets by mouth 2 (two) times daily.   [DISCONTINUED] lisinopril (ZESTRIL) 5 MG tablet Take 5 mg by mouth daily. (Patient not taking: Reported on 06/02/2023)   No facility-administered encounter medications on file as of 06/02/2023.     ALLERGIES: Allergies  Allergen Reactions   Clindamycin/Lincomycin Rash    VACCINATION STATUS: Immunization History  Administered Date(s) Administered   Moderna Sars-Covid-2 Vaccination 06/19/2019, 07/20/2019    Diabetes He presents for his follow-up diabetic visit. He has type 2 diabetes mellitus. Onset time: Diagnosed at approx age of 31. His disease course has been stable. There are no hypoglycemic associated symptoms. There are no diabetic associated symptoms. Pertinent negatives for diabetes include no weight loss. There are no hypoglycemic complications. There are no diabetic complications. Risk factors for coronary artery disease include diabetes mellitus, family history, obesity, male sex, hypertension, dyslipidemia and sedentary lifestyle. Current diabetic treatment includes oral agent (dual therapy). He is compliant with treatment most of the time. His weight is decreasing steadily. He is following a generally healthy diet. When asked about meal planning, he reported none. He has not had a previous visit with a dietitian. He rarely participates in exercise. His home blood glucose trend is fluctuating minimally. His breakfast blood glucose range is generally 90-110 mg/dl. (He presents today, accompanied by his mother, with his logs showing inconsistent monitoring but mostly at target glycemic profile.  His POCT A1c today is 5.6%, unchanged from previous visit.  He did have hypoglycemia in the 40s on 1 occasion.  He did have predisone for allergic reaction to an antibiotic for tooth infection.) An ACE inhibitor/angiotensin II receptor blocker is being taken. He does not see a podiatrist.Eye exam is current.     Review of systems  Constitutional: + stable body weight, current Body mass index is 32.28 kg/m., no fatigue, no subjective hyperthermia, no subjective hypothermia Eyes: no blurry vision, no xerophthalmia, right eye deviates from midline ENT: no sore throat, no nodules  palpated in throat, no dysphagia/odynophagia, no hoarseness Cardiovascular: no chest pain, no shortness of breath, no palpitations, no leg swelling Respiratory: no cough, no shortness of breath Gastrointestinal: no nausea/vomiting/diarrhea Musculoskeletal: no muscle/joint aches Skin: no rashes, no hyperemia Neurological: + essential tremor, no numbness, no tingling, no dizziness Psychiatric: no depression, no anxiety  Objective:     BP 110/80 (BP Location: Left Arm, Patient Position: Sitting, Cuff Size: Large)   Pulse 75   Ht 5\' 6"  (1.676 m)   Wt 200 lb (90.7 kg)   BMI 32.28 kg/m   Wt Readings from Last 3 Encounters:  06/02/23 200 lb (90.7 kg)  11/15/22 204 lb 6.4 oz (92.7 kg)  07/16/22 201  lb 9.6 oz (91.4 kg)     BP Readings from Last 3 Encounters:  06/02/23 110/80  04/13/23 (!) 137/91  11/15/22 128/85     Physical Exam- Limited  Constitutional:  Body mass index is 32.28 kg/m. , not in acute distress, normal state of mind, mild cognitive impairment Eyes:  EOMI, no exophthalmos, right eye deviates from midline Neck: Supple Musculoskeletal: no gross deformities, strength intact in all four extremities, no gross restriction of joint movements Skin:  no rashes, no hyperemia  Diabetic Foot Exam - Simple   No data filed     CMP ( most recent) CMP     Component Value Date/Time   NA 132 (L) 07/27/2019 2129   K 3.5 07/27/2019 2129   CL 100 07/27/2019 2129   CO2 22 07/27/2019 2129   GLUCOSE 217 (H) 07/27/2019 2129   BUN 10 12/12/2021 0000   CREATININE 0.8 12/12/2021 0000   CREATININE 0.91 07/27/2019 2129   CALCIUM 8.8 (L) 07/27/2019 2129   PROT 7.6 07/27/2019 2129   ALBUMIN 4.2 07/27/2019 2129   AST 17 07/27/2019 2129   ALT 27 07/27/2019 2129   ALKPHOS 72 07/27/2019 2129   BILITOT 0.8 07/27/2019 2129   GFRNONAA >60 07/27/2019 2129   GFRAA >60 07/27/2019 2129     Diabetic Labs (most recent): Lab Results  Component Value Date   HGBA1C 5.6 06/02/2023    HGBA1C 5.6 11/15/2022   HGBA1C 6.0 (A) 07/16/2022   MICROALBUR 80mg /L 07/16/2022     Lipid Panel ( most recent) Lipid Panel     Component Value Date/Time   TRIG 443 (A) 12/12/2021 0000   LDLCALC 72 12/12/2021 0000      Lab Results  Component Value Date   TSH 2.85 12/12/2021           Assessment & Plan:   1) Type 2 diabetes mellitus with hyperglycemia, without long-term current use of insulin (HCC)  He presents today, accompanied by his mother, with his logs showing inconsistent monitoring but mostly at target glycemic profile.  His POCT A1c today is 5.6%, unchanged from previous visit.  He did have hypoglycemia in the 40s on 1 occasion.  He did have predisone for allergic reaction to an antibiotic for tooth infection.  He notes he never did cut his medication in half like he was supposed to.  - Aaron Coffey has currently uncontrolled symptomatic type 2 DM since 48 years of age.   -Recent labs reviewed.  - I had a long discussion with him about the progressive nature of diabetes and the pathology behind its complications. -his diabetes is not currently complicated but he remains at a high risk for more acute and chronic complications which include CAD, CVA, CKD, retinopathy, and neuropathy. These are all discussed in detail with him.  The following Lifestyle Medicine recommendations according to American College of Lifestyle Medicine [facility]) were discussed and offered to patient and he agrees to start the journey:  A. Whole Foods, Plant-based plate comprising of fruits and vegetables, plant-based proteins, whole-grain carbohydrates was discussed in detail with the patient.   A list for source of those nutrients were also provided to the patient.  Patient will use only water or unsweetened tea for hydration. B.  The need to stay away from risky substances including alcohol, smoking; obtaining 7 to 9 hours of restorative sleep, at least 150 minutes of moderate intensity  exercise weekly, the importance of healthy social connections,  and stress reduction techniques were discussed.  C.  A full color page of  Calorie density of various food groups per pound showing examples of each food groups was provided to the patient.  - Nutritional counseling repeated at each appointment due to patients tendency to fall back in to old habits.  - The patient admits there is a room for improvement in their diet and drink choices. -  Suggestion is made for the patient to avoid simple carbohydrates from their diet including Cakes, Sweet Desserts / Pastries, Ice Cream, Soda (diet and regular), Sweet Tea, Candies, Chips, Cookies, Sweet Pastries, Store Bought Juices, Alcohol in Excess of 1-2 drinks a day, Artificial Sweeteners, Coffee Creamer, and "Sugar-free" Products. This will help patient to have stable blood glucose profile and potentially avoid unintended weight gain.   - I encouraged the patient to switch to unprocessed or minimally processed complex starch and increased protein intake (animal or plant source), fruits, and vegetables.   - Patient is advised to stick to a routine mealtimes to eat 3 meals a day and avoid unnecessary snacks (to snack only to correct hypoglycemia).  - I have approached him with the following individualized plan to manage his diabetes and patient agrees:   - he is advised to lower his Janumet to half tab of 50/1000 mg po twice daily, therapeutically suitable for patient.  There is a possibility he will be able to come off his medications altogether in the future.  -he is encouraged to continue monitoring glucose at least once daily 2-3 days per week, before breakfast and to call the clinic if he has readings less than 70 or above 200 for 3 tests in a row.  - Adjustment parameters are given to him for hypo and hyperglycemia in writing.  - Specific targets for  A1c; LDL, HDL, and Triglycerides were discussed with the patient.  2) Blood Pressure  /Hypertension:  his blood pressure is controlled to target.   he is advised to continue his current medications including Lisinopril 5 mg p.o. daily with breakfast, and Norvasc 5 mg po daily.Marland Kitchen  3) Lipids/Hyperlipidemia:    Review of his recent lipid panel from 12/12/21 showed controlled LDL at 72 and elevated triglycerides of 443 .  he is advised to continue Simvastatin 20 mg daily at bedtime.  Side effects and precautions discussed with him.  4)  Weight/Diet:  his Body mass index is 32.28 kg/m.  -  clearly complicating his diabetes care.   he is a candidate for weight loss. I discussed with him the fact that loss of 5 - 10% of his  current body weight will have the most impact on his diabetes management.  Exercise, and detailed carbohydrates information provided  -  detailed on discharge instructions.  5) Chronic Care/Health Maintenance: -he is on ACEI/ARB and Statin medications and is encouraged to initiate and continue to follow up with Ophthalmology, Dentist, Podiatrist at least yearly or according to recommendations, and advised to stay away from smoking. I have recommended yearly flu vaccine and pneumonia vaccine at least every 5 years; moderate intensity exercise for up to 150 minutes weekly; and sleep for at least 7 hours a day.  - he is advised to maintain close follow up with Elfredia Nevins, MD for primary care needs, as well as his other providers for optimal and coordinated care.     I spent  43  minutes in the care of the patient today including review of labs from CMP, Lipids, Thyroid Function, Hematology (current and previous including abstractions  from other facilities); face-to-face time discussing  his blood glucose readings/logs, discussing hypoglycemia and hyperglycemia episodes and symptoms, medications doses, his options of short and long term treatment based on the latest standards of care / guidelines;  discussion about incorporating lifestyle medicine;  and documenting the  encounter. Risk reduction counseling performed per USPSTF guidelines to reduce obesity and cardiovascular risk factors.     Please refer to Patient Instructions for Blood Glucose Monitoring and Insulin/Medications Dosing Guide"  in media tab for additional information. Please  also refer to " Patient Self Inventory" in the Media  tab for reviewed elements of pertinent patient history.  Aaron Coffey participated in the discussions, expressed understanding, and voiced agreement with the above plans.  All questions were answered to his satisfaction. he is encouraged to contact clinic should he have any questions or concerns prior to his return visit.     Follow up plan: - Return in about 4 months (around 10/02/2023) for Diabetes F/U with A1c in office, No previsit labs.   Ronny Bacon, [facility] [address] [place], [place] [address] Phone: [phone] Fax: [phone]  06/02/2023, 11:19 AM

## 2023-06-27 DIAGNOSIS — G249 Dystonia, unspecified: Secondary | ICD-10-CM | POA: Diagnosis not present

## 2023-06-27 DIAGNOSIS — G2589 Other specified extrapyramidal and movement disorders: Secondary | ICD-10-CM | POA: Diagnosis not present

## 2023-07-16 ENCOUNTER — Ambulatory Visit
Admission: EM | Admit: 2023-07-16 | Discharge: 2023-07-16 | Disposition: A | Discharge status: Home / Self Care | Attending: Nurse Practitioner | Admitting: Nurse Practitioner

## 2023-07-16 DIAGNOSIS — J309 Allergic rhinitis, unspecified: Secondary | ICD-10-CM | POA: Diagnosis not present

## 2023-07-16 LAB — POC COVID19/FLU A&B COMBO
Covid Antigen, POC: NEGATIVE
Influenza A Antigen, POC: NEGATIVE
Influenza B Antigen, POC: NEGATIVE

## 2023-07-16 MED ORDER — FLUTICASONE PROPIONATE 50 MCG/ACT NA SUSP: 2.0000 | Freq: Every day | NASAL | 0 refills | Status: AC

## 2023-07-16 MED ORDER — PSEUDOEPH-BROMPHEN-DM 30-2-10 MG/5ML PO SYRP: 5.0000 mL | ORAL_SOLUTION | Freq: Four times a day (QID) | ORAL | 0 refills | Status: AC | PRN

## 2023-07-16 MED ORDER — LORATADINE 10 MG PO TABS: 10.0000 mg | ORAL_TABLET | Freq: Every day | ORAL | 0 refills | Status: DC

## 2023-07-16 NOTE — ED Triage Notes (Signed)
Pt reports cough and congestion x 2 days.

## 2023-07-16 NOTE — Discharge Instructions (Addendum)
 The covid/flu test was negative.  Take medication as directed. Increase fluids and get plenty of rest. May take over-the-counter ibuprofen or Tylenol as needed for pain, fever, or general discomfort. Recommend normal saline nasal spray to help with nasal congestion throughout the day. For your cough, it may be helpful to use a humidifier in the bedroom at nighttime during sleep and sleeping elevated on pillows while symptoms persist. If symptoms do not improve, or appear to be worsening, you may follow-up in this clinic or with your primary care physician for further evaluation. Follow-up as needed.

## 2023-07-16 NOTE — ED Provider Notes (Signed)
 [facility]    CSN: [id number] Arrival date & time: 07/16/23  5638      History   Chief Complaint No chief complaint on file.   HPI Aaron Coffey is a 48 y.o. male.   The history is provided by the patient and a parent.   Patient presents with his mother for complaints of nasal congestion, cough, and sore throat.  Symptoms started 2 days ago.  Mother reports symptoms started after the patient mowed the lawn.  Patient denies fever, chills, ear pain, ear drainage, wheezing, difficulty breathing, chest pain, abdominal pain, nausea, vomiting, diarrhea, or rash.  Patient denies history of seasonal allergies.  Reports he has been using Afrin nasal spray for his symptoms.  Denies any obvious known sick contacts. Past Medical History:  Diagnosis Date   Dystonia    Essential hypertension    Tremor     Patient Active Problem List   Diagnosis Date Noted   ADD (attention deficit disorder) without hyperactivity 05/30/2022   Diabetes mellitus (HCC) 05/30/2022   Hyperlipidemia 05/30/2022   Tremor 05/30/2022   Appendicitis 12/19/2017   Intentional self-harm by knife (HCC)    Adjustment disorder with mixed disturbance of emotions and conduct     Past Surgical History:  Procedure Laterality Date   CHOLECYSTECTOMY     LAPAROSCOPIC APPENDECTOMY N/A 12/19/2017   Procedure: APPENDECTOMY LAPAROSCOPIC;  Surgeon: Franky Macho, MD;  Location: [facility];  Service: General;  Laterality: N/A;       Home Medications    Prior to Admission medications   Medication Sig Start Date End Date Taking? Authorizing Provider  brompheniramine-pseudoephedrine-DM 30-2-10 MG/5ML syrup Take 5 mLs by mouth 4 (four) times daily as needed. 07/16/23  Yes Leath-Warren, Sadie Haber, NP  fluticasone (FLONASE) 50 MCG/ACT nasal spray Place 2 sprays into both nostrils daily. 07/16/23  Yes Leath-Warren, Sadie Haber, NP  loratadine (CLARITIN) 10 MG tablet Take 1 tablet (10 mg total) by mouth daily. 07/16/23   Yes Leath-Warren, Sadie Haber, NP  amLODipine (NORVASC) 5 MG tablet Take 1 tablet (5 mg total) by mouth daily. 11/01/18   Eber Hong, MD  ibuprofen (ADVIL) 800 MG tablet Take 1 tablet (800 mg total) by mouth 3 (three) times daily. 06/05/22   Leath-Warren, Sadie Haber, NP  ipratropium (ATROVENT) 0.03 % nasal spray Place 2 sprays into both nostrils 2 (two) times daily. 03/15/21   Wallis Bamberg, PA-C  JANUMET 50-1000 MG tablet Take 0.5 tablets by mouth 2 (two) times daily. 06/02/23   Dani Gobble, NP  lisinopril (ZESTRIL) 10 MG tablet Take 10 mg by mouth daily. 04/14/23   [provider]  Omega-3 Fatty Acids (FISH OIL PO) Take 1 capsule by mouth daily.    [provider]  primidone (MYSOLINE) 50 MG tablet Take 100 mg by mouth 2 (two) times daily. 01/10/14   [provider]  simvastatin (ZOCOR) 20 MG tablet Take 20 mg by mouth every evening. 01/10/14   [provider]  triamcinolone cream (KENALOG) 0.1 % Apply 1 Application topically 2 (two) times daily. Avoid use on face or private areas 04/13/23   Particia Nearing, PA-C  trihexyphenidyl (ARTANE) 2 MG tablet Take 1.5 tablets by mouth daily. 10/12/17   [provider]  trimethoprim-polymyxin b (POLYTRIM) ophthalmic solution Place 1 drop into both eyes 3 (three) times daily. For 7 days 06/11/22   Rodriguez-Southworth, Nettie Elm, PA-C    Family History Family History  Problem Relation Age of Onset   Hypertension Mother  Social History Social History   Tobacco Use   Smoking status: Never   Smokeless tobacco: Never  Vaping Use   Vaping status: Never Used  Substance Use Topics   Alcohol use: Yes    Alcohol/week: 1.0 standard drink of alcohol    Types: 1 Cans of beer per week    Comment: Occasional   Drug use: No     Allergies   Clindamycin/lincomycin   Review of Systems Review of Systems Per HPI  Physical Exam Triage Vital Signs ED Triage Vitals  Encounter Vitals Group     BP  07/16/23 0836 (!) 149/90     Systolic BP Percentile --      Diastolic BP Percentile --      Pulse Rate 07/16/23 0836 74     Resp 07/16/23 0836 18     Temp 07/16/23 0836 98.5 F (36.9 C)     Temp Source 07/16/23 0836 Oral     SpO2 07/16/23 0836 97 %     Weight --      Height --      Head Circumference --      Peak Flow --      Pain Score 07/16/23 0840 0     Pain Loc --      Pain Education --      Exclude from Growth Chart --    No data found.  Updated Vital Signs BP (!) 149/90 (BP Location: Right Arm)   Pulse 74   Temp 98.5 F (36.9 C) (Oral)   Resp 18   SpO2 97%   Visual Acuity Right Eye Distance:   Left Eye Distance:   Bilateral Distance:    Right Eye Near:   Left Eye Near:    Bilateral Near:     Physical Exam Vitals and nursing note reviewed.  Constitutional:      General: He is not in acute distress.    Appearance: Normal appearance.  HENT:     Head: Normocephalic.     Right Ear: Tympanic membrane, ear canal and external ear normal.     Left Ear: Tympanic membrane, ear canal and external ear normal.     Nose: Congestion present.     Right Turbinates: Enlarged and swollen.     Left Turbinates: Enlarged and swollen.     Right Sinus: No maxillary sinus tenderness or frontal sinus tenderness.     Left Sinus: No maxillary sinus tenderness or frontal sinus tenderness.     Mouth/Throat:     Lips: Pink.     Mouth: Mucous membranes are moist.     Pharynx: Uvula midline. Posterior oropharyngeal erythema and postnasal drip present. No pharyngeal swelling, oropharyngeal exudate or uvula swelling.  Eyes:     Extraocular Movements: Extraocular movements intact.     Conjunctiva/sclera: Conjunctivae normal.     Pupils: Pupils are equal, round, and reactive to light.  Cardiovascular:     Rate and Rhythm: Normal rate and regular rhythm.     Pulses: Normal pulses.     Heart sounds: Normal heart sounds.  Pulmonary:     Effort: Pulmonary effort is normal. No respiratory  distress.     Breath sounds: Normal breath sounds. No stridor. No wheezing, rhonchi or rales.  Abdominal:     General: Bowel sounds are normal.     Palpations: Abdomen is soft.     Tenderness: There is no abdominal tenderness.  Musculoskeletal:     Cervical back: Normal range of motion.  Lymphadenopathy:  Cervical: No cervical adenopathy.  Skin:    General: Skin is warm and dry.  Neurological:     General: No focal deficit present.     Mental Status: He is alert and oriented to person, place, and time.  Psychiatric:        Mood and Affect: Mood normal.        Behavior: Behavior normal.      UC Treatments / Results  Labs (all labs ordered are listed, but only abnormal results are displayed) Labs Reviewed  POC COVID19/FLU A&B COMBO    EKG   Radiology No results found.  Procedures Procedures (including critical care time)  Medications Ordered in UC Medications - No data to display  Initial Impression / Assessment and Plan / UC Course  I have reviewed the triage vital signs and the nursing notes.  Pertinent labs & imaging results that were available during my care of the patient were reviewed by me and considered in my medical decision making (see chart for details).  COVID/flu test was negative.  Symptoms most likely caused after the patient mowed the lawn consistent with allergic rhinitis.  Symptomatic treatment provided with Claritin 10 mg, Bromfed-DM for cough and nasal congestion, and fluticasone 50 mcg nasal spray for nasal congestion and runny nose.  Supportive care recommendations were provided and discussed with the patient and his mother to include fluids, rest, over-the-counter analgesics, normal saline nasal spray, use of a humidifier during sleep.  Discussed indications with patient and his mother regarding follow-up.  Mother and patient were in agreement with this plan of care and verbalized understanding.  All questions were answered.  Patient stable for  discharge.  Final Clinical Impressions(s) / UC Diagnoses   Final diagnoses:  Allergic rhinitis, unspecified seasonality, unspecified trigger     Discharge Instructions      The covid/flu test was negative.  Take medication as directed. Increase fluids and get plenty of rest. May take over-the-counter ibuprofen or Tylenol as needed for pain, fever, or general discomfort. Recommend normal saline nasal spray to help with nasal congestion throughout the day. For your cough, it may be helpful to use a humidifier in the bedroom at nighttime during sleep and sleeping elevated on pillows while symptoms persist. If symptoms do not improve, or appear to be worsening, you may follow-up in this clinic or with your primary care physician for further evaluation. Follow-up as needed.      ED Prescriptions     Medication Sig Dispense Auth. Provider   loratadine (CLARITIN) 10 MG tablet Take 1 tablet (10 mg total) by mouth daily. 30 tablet Leath-Warren, Belen Bowers, NP   fluticasone (FLONASE) 50 MCG/ACT nasal spray Place 2 sprays into both nostrils daily. 16 g Leath-Warren, Belen Bowers, NP   brompheniramine-pseudoephedrine-DM 30-2-10 MG/5ML syrup Take 5 mLs by mouth 4 (four) times daily as needed. 140 mL Leath-Warren, Belen Bowers, NP      PDMP not reviewed this encounter.   Hardy Lia, NP 07/16/23 [phone]

## 2023-10-02 ENCOUNTER — Ambulatory Visit (INDEPENDENT_AMBULATORY_CARE_PROVIDER_SITE_OTHER): Admitting: Nurse Practitioner

## 2023-10-02 ENCOUNTER — Encounter: Payer: Self-pay | Admitting: Nurse Practitioner

## 2023-10-02 VITALS — BP 118/82 | HR 78 | Ht 66.0 in | Wt 207.6 lb

## 2023-10-02 DIAGNOSIS — E1165 Type 2 diabetes mellitus with hyperglycemia: Secondary | ICD-10-CM

## 2023-10-02 DIAGNOSIS — E782 Mixed hyperlipidemia: Secondary | ICD-10-CM | POA: Diagnosis not present

## 2023-10-02 DIAGNOSIS — Z7984 Long term (current) use of oral hypoglycemic drugs: Secondary | ICD-10-CM | POA: Diagnosis not present

## 2023-10-02 LAB — POCT GLYCOSYLATED HEMOGLOBIN (HGB A1C): Hemoglobin A1C: 6.6 % — AB (ref 4.0–5.6)

## 2023-10-02 NOTE — Progress Notes (Signed)
 Endocrinology Follow Up Note       10/02/2023, 11:14 AM   Subjective:    Patient ID: Aaron Coffey, male    DOB: 24-Apr-1975.  Aaron Coffey is being seen in follow up after being seen in consultation for management of currently uncontrolled symptomatic diabetes requested by  Bertell Satterfield, MD.   Past Medical History:  Diagnosis Date   Dystonia    Essential hypertension    Tremor     Past Surgical History:  Procedure Laterality Date   CHOLECYSTECTOMY     LAPAROSCOPIC APPENDECTOMY N/A 12/19/2017   Procedure: APPENDECTOMY LAPAROSCOPIC;  Surgeon: Mavis Anes, MD;  Location: [facility];  Service: General;  Laterality: N/A;    Social History   Socioeconomic History   Marital status: Single    Spouse name: Not on file   Number of children: Not on file   Years of education: Not on file   Highest education level: Not on file  Occupational History   Not on file  Tobacco Use   Smoking status: Never   Smokeless tobacco: Never  Vaping Use   Vaping status: Never Used  Substance and Sexual Activity   Alcohol use: Yes    Alcohol/week: 1.0 standard drink of alcohol    Types: 1 Cans of beer per week    Comment: Occasional   Drug use: No   Sexual activity: Never  Other Topics Concern   Not on file  Social History Narrative   Not on file   Social Drivers of Health   Financial Resource Strain: Low Risk  (05/30/2022)   Overall Financial Resource Strain (CARDIA)    Difficulty of Paying Living Expenses: Not very hard  Food Insecurity: No Food Insecurity (05/30/2022)   Hunger Vital Sign    Worried About Running Out of Food in the Last Year: Never true    Ran Out of Food in the Last Year: Never true  Transportation Needs: Not on file  Physical Activity: Inactive (05/30/2022)   Exercise Vital Sign    Days of Exercise per Week: 0 days    Minutes of Exercise per Session: 0 min  Stress: No Stress Concern  Present (05/30/2022)   Aaron Coffey of [employer]ational [employer] - Occupational Stress Questionnaire    Feeling of Stress : Only a little  Social Connections: Socially Isolated (05/30/2022)   Social Connection and Isolation Panel    Frequency of Communication with Friends and Family: More than three times a week    Frequency of Social Gatherings with Friends and Family: Twice a week    Attends Religious Services: Never    Database administrator or Organizations: No    Attends Banker Meetings: Never    Marital Status: Never married    Family History  Problem Relation Age of Onset   Hypertension Mother     Outpatient Encounter Medications as of 10/02/2023  Medication Sig   amLODipine  (NORVASC ) 5 MG tablet Take 1 tablet (5 mg total) by mouth daily.   brompheniramine-pseudoephedrine -DM 30-2-10 MG/5ML syrup Take 5 mLs by mouth 4 (four) times daily as needed.   fluticasone  (FLONASE ) 50 MCG/ACT nasal spray Place 2 sprays into both nostrils  daily.   ibuprofen  (ADVIL ) 800 MG tablet Take 1 tablet (800 mg total) by mouth 3 (three) times daily.   ipratropium (ATROVENT ) 0.03 % nasal spray Place 2 sprays into both nostrils 2 (two) times daily.   JANUMET  50-1000 MG tablet Take 0.5 tablets by mouth 2 (two) times daily.   lisinopril (ZESTRIL) 10 MG tablet Take 10 mg by mouth daily.   loratadine  (CLARITIN ) 10 MG tablet Take 1 tablet (10 mg total) by mouth daily.   Omega-3 Fatty Acids (FISH OIL PO) Take 1 capsule by mouth daily.   primidone  (MYSOLINE ) 50 MG tablet Take 100 mg by mouth 2 (two) times daily.   simvastatin  (ZOCOR ) 20 MG tablet Take 20 mg by mouth every evening.   triamcinolone  cream (KENALOG ) 0.1 % Apply 1 Application topically 2 (two) times daily. Avoid use on face or private areas   trihexyphenidyl  (ARTANE ) 2 MG tablet Take 1.5 tablets by mouth daily.   trimethoprim -polymyxin b  (POLYTRIM ) ophthalmic solution Place 1 drop into both eyes 3 (three) times daily. For 7 days   No  facility-administered encounter medications on file as of 10/02/2023.    ALLERGIES: Allergies  Allergen Reactions   Clindamycin/Lincomycin Rash    VACCINATION STATUS: Immunization History  Administered Date(s) Administered   Moderna Sars-Covid-2 Vaccination 06/19/2019, 07/20/2019    Diabetes He presents for his follow-up diabetic visit. He has type 2 diabetes mellitus. Onset time: Diagnosed at approx age of 58. His disease course has been stable. There are no hypoglycemic associated symptoms. There are no diabetic associated symptoms. Pertinent negatives for diabetes include no weight loss. There are no hypoglycemic complications. There are no diabetic complications. Risk factors for coronary artery disease include diabetes mellitus, family history, obesity, male sex, hypertension, dyslipidemia and sedentary lifestyle. Current diabetic treatment includes oral agent (dual therapy). He is compliant with treatment most of the time. His weight is decreasing steadily. He is following a generally healthy diet. When asked about meal planning, he reported none. He has not had a previous visit with a dietitian. He rarely participates in exercise. His home blood glucose trend is fluctuating minimally. His breakfast blood glucose range is generally 110-130 mg/dl. (He presents today, accompanied by his mother, with his logs showing inconsistent monitoring but mostly at target glycemic profile.  His POCT A1c today is 6.3%, increasing from last visit of 5.6% but still a good target.  He just got back from a cruise to the [place].) An ACE inhibitor/angiotensin II receptor blocker is being taken. He does not see a podiatrist.Eye exam is current.     Review of systems  Constitutional: + stable body weight, current Body mass index is 33.51 kg/m., no fatigue, no subjective hyperthermia, no subjective hypothermia Eyes: no blurry vision, no xerophthalmia, right eye deviates from midline ENT: no sore throat, no  nodules palpated in throat, no dysphagia/odynophagia, no hoarseness Cardiovascular: no chest pain, no shortness of breath, no palpitations, no leg swelling Respiratory: no cough, no shortness of breath Gastrointestinal: no nausea/vomiting/diarrhea Musculoskeletal: no muscle/joint aches Skin: no rashes, no hyperemia Neurological: + essential tremor, no numbness, no tingling, no dizziness Psychiatric: no depression, no anxiety  Objective:     BP 118/82 (BP Location: Left Arm, Patient Position: Sitting, Cuff Size: Large)   Pulse 78   Ht 5' 6 (1.676 m)   Wt 207 lb 9.6 oz (94.2 kg)   BMI 33.51 kg/m   Wt Readings from Last 3 Encounters:  10/02/23 207 lb 9.6 oz (94.2 kg)  06/02/23 200  lb (90.7 kg)  11/15/22 204 lb 6.4 oz (92.7 kg)     BP Readings from Last 3 Encounters:  10/02/23 118/82  07/16/23 (!) 149/90  06/02/23 110/80     Physical Exam- Limited  Constitutional:  Body mass index is 33.51 kg/m. , not in acute distress, normal state of mind, mild cognitive impairment Eyes:  EOMI, no exophthalmos, right eye deviates from midline Neck: Supple Musculoskeletal: no gross deformities, strength intact in all four extremities, no gross restriction of joint movements Skin:  no rashes, no hyperemia  Diabetic Foot Exam - Simple   No data filed     CMP ( most recent) CMP     Component Value Date/Time   NA 132 (L) 07/27/2019 2129   K 3.5 07/27/2019 2129   CL 100 07/27/2019 2129   CO2 22 07/27/2019 2129   GLUCOSE 217 (H) 07/27/2019 2129   BUN 10 12/12/2021 0000   CREATININE 0.8 12/12/2021 0000   CREATININE 0.91 07/27/2019 2129   CALCIUM 8.8 (L) 07/27/2019 2129   PROT 7.6 07/27/2019 2129   ALBUMIN 4.2 07/27/2019 2129   AST 17 07/27/2019 2129   ALT 27 07/27/2019 2129   ALKPHOS 72 07/27/2019 2129   BILITOT 0.8 07/27/2019 2129   GFRNONAA >60 07/27/2019 2129   GFRAA >60 07/27/2019 2129     Diabetic Labs (most recent): Lab Results  Component Value Date   HGBA1C 6.6 (A)  10/02/2023   HGBA1C 5.6 06/02/2023   HGBA1C 5.6 11/15/2022   MICROALBUR 80mg /L 07/16/2022     Lipid Panel ( most recent) Lipid Panel     Component Value Date/Time   TRIG 443 (A) 12/12/2021 0000   LDLCALC 72 12/12/2021 0000      Lab Results  Component Value Date   TSH 2.85 12/12/2021           Assessment & Plan:   1) Type 2 diabetes mellitus with hyperglycemia, without long-term current use of insulin  (HCC)  He presents today, accompanied by his mother, with his logs showing inconsistent monitoring but mostly at target glycemic profile.  His POCT A1c today is 6.3%, increasing from last visit of 5.6% but still a good target.  He just got back from a cruise to the [place].  - Aaron Coffey has currently uncontrolled symptomatic type 2 DM since 48 years of age.   -Recent labs reviewed.  - I had a long discussion with him about the progressive nature of diabetes and the pathology behind its complications. -his diabetes is not currently complicated but he remains at a high risk for more acute and chronic complications which include CAD, CVA, CKD, retinopathy, and neuropathy. These are all discussed in detail with him.  The following Lifestyle Medicine recommendations according to American College of Lifestyle Medicine [facility]) were discussed and offered to patient and he agrees to start the journey:  A. Whole Foods, Plant-based plate comprising of fruits and vegetables, plant-based proteins, whole-grain carbohydrates was discussed in detail with the patient.   A list for source of those nutrients were also provided to the patient.  Patient will use only water or unsweetened tea for hydration. B.  The need to stay away from risky substances including alcohol, smoking; obtaining 7 to 9 hours of restorative sleep, at least 150 minutes of moderate intensity exercise weekly, the importance of healthy social connections,  and stress reduction techniques were discussed. C.  A full color  page of  Calorie density of various food groups per pound showing  examples of each food groups was provided to the patient.  - Nutritional counseling repeated at each appointment due to patients tendency to fall back in to old habits.  - The patient admits there is a room for improvement in their diet and drink choices. -  Suggestion is made for the patient to avoid simple carbohydrates from their diet including Cakes, Sweet Desserts / Pastries, Ice Cream, Soda (diet and regular), Sweet Tea, Candies, Chips, Cookies, Sweet Pastries, Store Bought Juices, Alcohol in Excess of 1-2 drinks a day, Artificial Sweeteners, Coffee Creamer, and Sugar-free Products. This will help patient to have stable blood glucose profile and potentially avoid unintended weight gain.   - I encouraged the patient to switch to unprocessed or minimally processed complex starch and increased protein intake (animal or plant source), fruits, and vegetables.   - Patient is advised to stick to a routine mealtimes to eat 3 meals a day and avoid unnecessary snacks (to snack only to correct hypoglycemia).  - I have approached him with the following individualized plan to manage his diabetes and patient agrees:   - he is advised to continue his Janumet  half tab of 50/1000 mg po twice daily, therapeutically suitable for patient.  There is a possibility he will be able to come off his medications altogether in the future, but will keep him on a bit longer to see if he can maintain on this low dose first.  -he is encouraged to continue monitoring glucose at least once daily 2-3 days per week, before breakfast and to call the clinic if he has readings less than 70 or above 200 for 3 tests in a row.  - Adjustment parameters are given to him for hypo and hyperglycemia in writing.  - Specific targets for  A1c; LDL, HDL, and Triglycerides were discussed with the patient.  2) Blood Pressure /Hypertension:  his blood pressure is controlled  to target.   he is advised to continue his current medications including Lisinopril 5 mg p.o. daily with breakfast, and Norvasc  5 mg po daily.SABRA  3) Lipids/Hyperlipidemia:    Review of his recent lipid panel from 12/12/21 showed controlled LDL at 72 and elevated triglycerides of 443 .  he is advised to continue Simvastatin  20 mg daily at bedtime.  Side effects and precautions discussed with him.  4)  Weight/Diet:  his Body mass index is 33.51 kg/m.  -  clearly complicating his diabetes care.   he is a candidate for weight loss. I discussed with him the fact that loss of 5 - 10% of his  current body weight will have the most impact on his diabetes management.  Exercise, and detailed carbohydrates information provided  -  detailed on discharge instructions.  5) Chronic Care/Health Maintenance: -he is on ACEI/ARB and Statin medications and is encouraged to initiate and continue to follow up with Ophthalmology, Dentist, Podiatrist at least yearly or according to recommendations, and advised to stay away from smoking. I have recommended yearly flu vaccine and pneumonia vaccine at least every 5 years; moderate intensity exercise for up to 150 minutes weekly; and sleep for at least 7 hours a day.  - he is advised to maintain close follow up with Bertell Satterfield, MD for primary care needs, as well as his other providers for optimal and coordinated care.     I spent  43  minutes in the care of the patient today including review of labs from CMP, Lipids, Thyroid  Function, Hematology (current and previous including  abstractions from other facilities); face-to-face time discussing  his blood glucose readings/logs, discussing hypoglycemia and hyperglycemia episodes and symptoms, medications doses, his options of short and long term treatment based on the latest standards of care / guidelines;  discussion about incorporating lifestyle medicine;  and documenting the encounter. Risk reduction counseling performed per  USPSTF guidelines to reduce obesity and cardiovascular risk factors.     Please refer to Patient Instructions for Blood Glucose Monitoring and Insulin /Medications Dosing Guide  in media tab for additional information. Please  also refer to  Patient Self Inventory in the Media  tab for reviewed elements of pertinent patient history.  Aaron Coffey participated in the discussions, expressed understanding, and voiced agreement with the above plans.  All questions were answered to his satisfaction. he is encouraged to contact clinic should he have any questions or concerns prior to his return visit.     Follow up plan: - Return in about 4 months (around 02/02/2024) for Diabetes F/U with A1c in office, No previsit labs.   Aaron Coffey, [facility] [address] [place], [place] [address] Phone: [phone] Fax: [phone]  10/02/2023, 11:14 AM

## 2023-10-15 DIAGNOSIS — G2589 Other specified extrapyramidal and movement disorders: Secondary | ICD-10-CM | POA: Diagnosis not present

## 2023-10-15 DIAGNOSIS — R251 Tremor, unspecified: Secondary | ICD-10-CM | POA: Diagnosis not present

## 2023-10-15 DIAGNOSIS — G249 Dystonia, unspecified: Secondary | ICD-10-CM | POA: Diagnosis not present

## 2023-11-04 ENCOUNTER — Other Ambulatory Visit: Payer: Self-pay | Admitting: Nurse Practitioner

## 2024-01-23 DIAGNOSIS — G249 Dystonia, unspecified: Secondary | ICD-10-CM | POA: Diagnosis not present

## 2024-01-23 DIAGNOSIS — G2589 Other specified extrapyramidal and movement disorders: Secondary | ICD-10-CM | POA: Diagnosis not present

## 2024-02-04 ENCOUNTER — Encounter: Payer: Self-pay | Admitting: Nurse Practitioner

## 2024-02-04 ENCOUNTER — Ambulatory Visit: Admitting: Physician Assistant

## 2024-02-04 ENCOUNTER — Encounter: Payer: Self-pay | Admitting: Physician Assistant

## 2024-02-04 ENCOUNTER — Other Ambulatory Visit: Payer: Self-pay | Admitting: Nurse Practitioner

## 2024-02-04 ENCOUNTER — Ambulatory Visit: Admitting: Nurse Practitioner

## 2024-02-04 VITALS — BP 126/80 | HR 68 | Ht 66.0 in | Wt 207.4 lb

## 2024-02-04 VITALS — BP 138/96 | HR 93 | Temp 98.2°F | Ht 66.0 in | Wt 208.8 lb

## 2024-02-04 DIAGNOSIS — E1165 Type 2 diabetes mellitus with hyperglycemia: Secondary | ICD-10-CM | POA: Diagnosis not present

## 2024-02-04 DIAGNOSIS — Z7984 Long term (current) use of oral hypoglycemic drugs: Secondary | ICD-10-CM

## 2024-02-04 DIAGNOSIS — Z1211 Encounter for screening for malignant neoplasm of colon: Secondary | ICD-10-CM

## 2024-02-04 DIAGNOSIS — E782 Mixed hyperlipidemia: Secondary | ICD-10-CM

## 2024-02-04 DIAGNOSIS — I10 Essential (primary) hypertension: Secondary | ICD-10-CM | POA: Diagnosis not present

## 2024-02-04 DIAGNOSIS — Z7689 Persons encountering health services in other specified circumstances: Secondary | ICD-10-CM

## 2024-02-04 DIAGNOSIS — Z23 Encounter for immunization: Secondary | ICD-10-CM

## 2024-02-04 LAB — POCT GLYCOSYLATED HEMOGLOBIN (HGB A1C): Hemoglobin A1C: 6.3 % — AB (ref 4.0–5.6)

## 2024-02-04 MED ORDER — PRODIGY NO CODING BLOOD GLUC VI STRP: ORAL_STRIP | 12 refills | Status: AC

## 2024-02-04 MED ORDER — JANUMET 50-1000 MG PO TABS: 0.5000 | ORAL_TABLET | Freq: Two times a day (BID) | ORAL | 1 refills | Status: DC

## 2024-02-04 MED ORDER — AMLODIPINE BESYLATE 5 MG PO TABS: 5.0000 mg | ORAL_TABLET | Freq: Every day | ORAL | 1 refills | Status: AC

## 2024-02-04 NOTE — Progress Notes (Signed)
 New Patient Office Visit  Subjective    Patient ID: Aaron Coffey, male    DOB: [birth year]  Age: 48 y.o. MRN: [id number]  CC:  Chief Complaint  Patient presents with   New Patient (Initial Visit)    Patient is a new patient.  Patient seems to not have any concerns.  Patient received flu shot    HPI Aaron Coffey presents to establish care  Discussed the use of AI scribe software for clinical note transcription with the patient, who gave verbal consent to proceed.  History of Present Illness Aaron Coffey is a 48 year old male who presents to establish care.  He takes Janumet  for diabetes, amlodipine  and lisinopril for hypertension, and simvastatin  for hypercholesterolemia. He recently received only a 15-day supply of amlodipine  due to insurance constraints and requests medication refill.  He is under neurology care for unknown tremors and receives Botox injections in both arms. He is prescribed primidone  and trihexyphenidyl  for this condition.  His blood pressure today is similar to a previous reading on January 23, 2024. He does not drive and relies on his caregiver for transportation.  He does not have concerns or complaints today but relates he is in a rush because he has another appointment in town shortly after our appointment.     Outpatient Encounter Medications as of 02/04/2024  Medication Sig   brompheniramine-pseudoephedrine -DM 30-2-10 MG/5ML syrup Take 5 mLs by mouth 4 (four) times daily as needed.   fluticasone  (FLONASE ) 50 MCG/ACT nasal spray Place 2 sprays into both nostrils daily. (Patient taking differently: Place 2 sprays into both nostrils daily. As needed)   ipratropium (ATROVENT ) 0.03 % nasal spray Place 2 sprays into both nostrils 2 (two) times daily.   JANUMET  50-1000 MG tablet TAKE ONE TABLET BY MOUTH TWICE A DAY   lisinopril (ZESTRIL) 10 MG tablet Take 10 mg by mouth daily.   Omega-3 Fatty Acids (FISH OIL PO) Take 1 capsule by mouth  daily.   primidone  (MYSOLINE ) 50 MG tablet Take 100 mg by mouth 2 (two) times daily.   simvastatin  (ZOCOR ) 20 MG tablet Take 20 mg by mouth every evening.   trihexyphenidyl  (ARTANE ) 2 MG tablet Take 1.5 tablets by mouth daily.   trimethoprim -polymyxin b  (POLYTRIM ) ophthalmic solution Place 1 drop into both eyes 3 (three) times daily. For 7 days   [DISCONTINUED] amLODipine  (NORVASC ) 5 MG tablet Take 1 tablet (5 mg total) by mouth daily.   [DISCONTINUED] ibuprofen  (ADVIL ) 800 MG tablet Take 1 tablet (800 mg total) by mouth 3 (three) times daily. (Patient not taking: Reported on 02/04/2024)   [DISCONTINUED] loratadine  (CLARITIN ) 10 MG tablet Take 1 tablet (10 mg total) by mouth daily. (Patient not taking: Reported on 02/04/2024)   amLODipine  (NORVASC ) 5 MG tablet Take 1 tablet (5 mg total) by mouth daily.   [DISCONTINUED] triamcinolone  cream (KENALOG ) 0.1 % Apply 1 Application topically 2 (two) times daily. Avoid use on face or private areas (Patient not taking: Reported on 02/04/2024)   No facility-administered encounter medications on file as of 02/04/2024.    Past Medical History:  Diagnosis Date   Dystonia    Essential hypertension    Tremor     Past Surgical History:  Procedure Laterality Date   CHOLECYSTECTOMY     LAPAROSCOPIC APPENDECTOMY N/A 12/19/2017   Procedure: APPENDECTOMY LAPAROSCOPIC;  Surgeon: Mavis Anes, MD;  Location: [facility];  Service: General;  Laterality: N/A;    Family History  Problem Relation Age of Onset  Hypertension Mother     Social History   Socioeconomic History   Marital status: Single    Spouse name: Not on file   Number of children: Not on file   Years of education: Not on file   Highest education level: Not on file  Occupational History   Not on file  Tobacco Use   Smoking status: Never   Smokeless tobacco: Never  Vaping Use   Vaping status: Never Used  Substance and Sexual Activity   Alcohol use: Yes    Alcohol/week: 1.0 standard drink  of alcohol    Types: 1 Cans of beer per week    Comment: Occasional   Drug use: No   Sexual activity: Never  Other Topics Concern   Not on file  Social History Narrative   Not on file   Social Drivers of Health   Financial Resource Strain: Low Risk  (05/30/2022)   Overall Financial Resource Strain (CARDIA)    Difficulty of Paying Living Expenses: Not very hard  Food Insecurity: No Food Insecurity (05/30/2022)   Hunger Vital Sign    Worried About Running Out of Food in the Last Year: Never true    Ran Out of Food in the Last Year: Never true  Transportation Needs: Not on file  Physical Activity: Inactive (05/30/2022)   Exercise Vital Sign    Days of Exercise per Week: 0 days    Minutes of Exercise per Session: 0 min  Stress: No Stress Concern Present (05/30/2022)   [employer] of Occupational Health - Occupational Stress Questionnaire    Feeling of Stress : Only a little  Social Connections: Socially Isolated (05/30/2022)   Social Connection and Isolation Panel    Frequency of Communication with Friends and Family: More than three times a week    Frequency of Social Gatherings with Friends and Family: Twice a week    Attends Religious Services: Never    Database Administrator or Organizations: No    Attends Banker Meetings: Never    Marital Status: Never married  Intimate Partner Violence: Not At Risk (05/30/2022)   Humiliation, Afraid, Rape, and Kick questionnaire    Fear of Current or Ex-Partner: No    Emotionally Abused: No    Physically Abused: No    Sexually Abused: No    Review of Systems  Constitutional:  Negative for chills, fever and malaise/fatigue.  Eyes:  Negative for blurred vision and double vision.  Respiratory:  Negative for cough and shortness of breath.   Cardiovascular:  Negative for chest pain and palpitations.  Neurological:  Positive for tremors. Negative for dizziness and headaches.        Objective    BP (!) 138/96   Pulse  93   Temp 98.2 F (36.8 C)   Ht 5' 6 (1.676 m)   Wt 208 lb 12 oz (94.7 kg)   SpO2 100%   BMI 33.69 kg/m   Physical Exam Constitutional:      General: He is not in acute distress.    Appearance: Normal appearance. He is obese. He is not ill-appearing.  HENT:     Head: Normocephalic and atraumatic.     Mouth/Throat:     Mouth: Mucous membranes are moist.     Pharynx: Oropharynx is clear.  Eyes:     Extraocular Movements: Extraocular movements intact.     Conjunctiva/sclera: Conjunctivae normal.  Cardiovascular:     Rate and Rhythm: Normal rate and regular rhythm.  Heart sounds: Normal heart sounds. No murmur heard. Pulmonary:     Effort: Pulmonary effort is normal.     Breath sounds: Normal breath sounds.  Musculoskeletal:     Right lower leg: No edema.     Left lower leg: No edema.  Skin:    General: Skin is warm and dry.  Neurological:     General: No focal deficit present.     Mental Status: He is alert and oriented to person, place, and time.  Psychiatric:        Mood and Affect: Mood normal.        Behavior: Behavior normal.       Assessment & Plan:  Encounter to establish care  Type 2 diabetes mellitus with hyperglycemia, without long-term current use of insulin  [facility]) Assessment & Plan: Not Controlled. Hyperglycemia. Follows with endocrinology.  Continue current management.  Lipid panel and urine ACR ordered today.  Increase dietary efforts and physical activity. Routine diabetic retinopathy screening: up-to-date. Foot exam and monofilament test not done today as patient was requesting a speedy appointment.   Orders: -     Comprehensive metabolic panel with GFR -     CBC with Differential/Platelet -     Microalbumin / creatinine urine ratio -     Lipid panel  Primary hypertension Assessment & Plan: 138/96 Stable. Continue current medications. No change in management. Monitor BP at home and follow up for readings consistently above 140/90.   Discussed DASH diet and dietary sodium restrictions.  Increase dietary efforts and physical activity.   Orders: -     Comprehensive metabolic panel with GFR -     CBC with Differential/Platelet -     Microalbumin / creatinine urine ratio -     amLODIPine  Besylate; Take 1 tablet (5 mg total) by mouth daily.  Dispense: 90 tablet; Refill: 1  Mixed hyperlipidemia Assessment & Plan: Stable. Continue with current management without changes. Discussed healthy diet and lifestyle.   Orders: -     Lipid panel  Screen for colon cancer -     Cologuard  Immunization due -     Flu vaccine trivalent PF, 6mos and older(Flulaval,Afluria,Fluarix,Fluzone)    Return in about 6 months (around 08/03/2024).   Charmaine Charlsey Moragne, PA-C

## 2024-02-04 NOTE — Assessment & Plan Note (Signed)
 Not Controlled. Hyperglycemia. Follows with endocrinology.  Continue current management.  Lipid panel and urine ACR ordered today.  Increase dietary efforts and physical activity. Routine diabetic retinopathy screening: up-to-date. Foot exam and monofilament test not done today as patient was requesting a speedy appointment.

## 2024-02-04 NOTE — Assessment & Plan Note (Signed)
 138/96 Stable. Continue current medications. No change in management. Monitor BP at home and follow up for readings consistently above 140/90.  Discussed DASH diet and dietary sodium restrictions.  Increase dietary efforts and physical activity.

## 2024-02-04 NOTE — Progress Notes (Signed)
 Endocrinology Follow Up Note       02/04/2024, 11:23 AM   Subjective:    Patient ID: Aaron Coffey, male    DOB: [birth year].  Aaron Coffey is being seen in follow up after being seen in consultation for management of currently uncontrolled symptomatic diabetes requested by  Grooms, Charmaine, PA-C.   Past Medical History:  Diagnosis Date   Dystonia    Essential hypertension    Tremor     Past Surgical History:  Procedure Laterality Date   CHOLECYSTECTOMY     LAPAROSCOPIC APPENDECTOMY N/A 12/19/2017   Procedure: APPENDECTOMY LAPAROSCOPIC;  Surgeon: Mavis Anes, MD;  Location: [facility];  Service: General;  Laterality: N/A;    Social History   Socioeconomic History   Marital status: Single    Spouse name: Not on file   Number of children: Not on file   Years of education: Not on file   Highest education level: Not on file  Occupational History   Not on file  Tobacco Use   Smoking status: Never   Smokeless tobacco: Never  Vaping Use   Vaping status: Never Used  Substance and Sexual Activity   Alcohol use: Yes    Alcohol/week: 1.0 standard drink of alcohol    Types: 1 Cans of beer per week    Comment: Occasional   Drug use: No   Sexual activity: Never  Other Topics Concern   Not on file  Social History Narrative   Not on file   Social Drivers of Health   Financial Resource Strain: Low Risk  (05/30/2022)   Overall Financial Resource Strain (CARDIA)    Difficulty of Paying Living Expenses: Not very hard  Food Insecurity: No Food Insecurity (05/30/2022)   Hunger Vital Sign    Worried About Running Out of Food in the Last Year: Never true    Ran Out of Food in the Last Year: Never true  Transportation Needs: Not on file  Physical Activity: Inactive (05/30/2022)   Exercise Vital Sign    Days of Exercise per Week: 0 days    Minutes of Exercise per Session: 0 min  Stress: No Stress  Concern Present (05/30/2022)   [employer] of Occupational Health - Occupational Stress Questionnaire    Feeling of Stress : Only a little  Social Connections: Socially Isolated (05/30/2022)   Social Connection and Isolation Panel    Frequency of Communication with Friends and Family: More than three times a week    Frequency of Social Gatherings with Friends and Family: Twice a week    Attends Religious Services: Never    Database Administrator or Organizations: No    Attends Banker Meetings: Never    Marital Status: Never married    Family History  Problem Relation Age of Onset   Hypertension Mother     Outpatient Encounter Medications as of 02/04/2024  Medication Sig   amLODipine  (NORVASC ) 5 MG tablet Take 1 tablet (5 mg total) by mouth daily.   brompheniramine-pseudoephedrine -DM 30-2-10 MG/5ML syrup Take 5 mLs by mouth 4 (four) times daily as needed.   fluticasone  (FLONASE ) 50 MCG/ACT nasal spray Place 2 sprays into both nostrils  daily. (Patient taking differently: Place 2 sprays into both nostrils daily. As needed)   ipratropium (ATROVENT ) 0.03 % nasal spray Place 2 sprays into both nostrils 2 (two) times daily.   lisinopril (ZESTRIL) 10 MG tablet Take 10 mg by mouth daily.   Omega-3 Fatty Acids (FISH OIL PO) Take 1 capsule by mouth daily.   primidone  (MYSOLINE ) 50 MG tablet Take 100 mg by mouth 2 (two) times daily.   simvastatin  (ZOCOR ) 20 MG tablet Take 20 mg by mouth every evening.   trihexyphenidyl  (ARTANE ) 2 MG tablet Take 1.5 tablets by mouth daily.   trimethoprim -polymyxin b  (POLYTRIM ) ophthalmic solution Place 1 drop into both eyes 3 (three) times daily. For 7 days   [DISCONTINUED] JANUMET  50-1000 MG tablet TAKE ONE TABLET BY MOUTH TWICE A DAY   JANUMET  50-1000 MG tablet Take 0.5 tablets by mouth 2 (two) times daily with a meal.   [DISCONTINUED] amLODipine  (NORVASC ) 5 MG tablet Take 1 tablet (5 mg total) by mouth daily.   [DISCONTINUED] ibuprofen  (ADVIL )  800 MG tablet Take 1 tablet (800 mg total) by mouth 3 (three) times daily. (Patient not taking: Reported on 02/04/2024)   [DISCONTINUED] loratadine  (CLARITIN ) 10 MG tablet Take 1 tablet (10 mg total) by mouth daily. (Patient not taking: Reported on 02/04/2024)   [DISCONTINUED] triamcinolone  cream (KENALOG ) 0.1 % Apply 1 Application topically 2 (two) times daily. Avoid use on face or private areas (Patient not taking: Reported on 02/04/2024)   No facility-administered encounter medications on file as of 02/04/2024.    ALLERGIES: Allergies  Allergen Reactions   Clindamycin/Lincomycin Rash    VACCINATION STATUS: Immunization History  Administered Date(s) Administered   Influenza, Seasonal, Injecte, Preservative Fre 02/04/2024   Moderna Sars-Covid-2 Vaccination 06/19/2019, 07/20/2019    Diabetes He presents for his follow-up diabetic visit. He has type 2 diabetes mellitus. Onset time: Diagnosed at approx age of 11. His disease course has been improving. There are no hypoglycemic associated symptoms. There are no diabetic associated symptoms. Pertinent negatives for diabetes include no weight loss. There are no hypoglycemic complications. There are no diabetic complications. Risk factors for coronary artery disease include diabetes mellitus, family history, obesity, male sex, hypertension, dyslipidemia and sedentary lifestyle. Current diabetic treatment includes oral agent (dual therapy). He is compliant with treatment most of the time. His weight is decreasing steadily. He is following a generally healthy diet. When asked about meal planning, he reported none. He has not had a previous visit with a dietitian. He rarely participates in exercise. His home blood glucose trend is fluctuating minimally. His breakfast blood glucose range is generally 110-130 mg/dl. (He presents today, accompanied by his mother, with his logs showing inconsistent monitoring but mostly at target glycemic profile.  His POCT A1c  today is 6.3%, improving from last visit of 6.6%.  He admits to drinking mostly Diet Mt Dew.) An ACE inhibitor/angiotensin II receptor blocker is being taken. He does not see a podiatrist.Eye exam is current.     Review of systems  Constitutional: + stable body weight, current Body mass index is 33.48 kg/m., no fatigue, no subjective hyperthermia, no subjective hypothermia Eyes: no blurry vision, no xerophthalmia, right eye deviates from midline ENT: no sore throat, no nodules palpated in throat, no dysphagia/odynophagia, no hoarseness Cardiovascular: no chest pain, no shortness of breath, no palpitations, no leg swelling Respiratory: no cough, no shortness of breath Gastrointestinal: no nausea/vomiting/diarrhea Musculoskeletal: no muscle/joint aches Skin: no rashes, no hyperemia Neurological: + essential tremor, no numbness, no tingling,  no dizziness Psychiatric: no depression, no anxiety  Objective:     BP 126/80 (BP Location: Right Arm, Patient Position: Sitting, Cuff Size: Large)   Pulse 68   Ht 5' 6 (1.676 m)   Wt 207 lb 6.4 oz (94.1 kg)   BMI 33.48 kg/m   Wt Readings from Last 3 Encounters:  02/04/24 207 lb 6.4 oz (94.1 kg)  02/04/24 208 lb 12 oz (94.7 kg)  10/02/23 207 lb 9.6 oz (94.2 kg)     BP Readings from Last 3 Encounters:  02/04/24 126/80  02/04/24 (!) 138/96  10/02/23 118/82     Physical Exam- Limited  Constitutional:  Body mass index is 33.48 kg/m. , not in acute distress, normal state of mind, mild cognitive impairment Eyes:  EOMI, no exophthalmos, right eye deviates from midline Neck: Supple Musculoskeletal: no gross deformities, strength intact in all four extremities, no gross restriction of joint movements Skin:  no rashes, no hyperemia  Diabetic Foot Exam - Simple   No data filed     CMP ( most recent) CMP     Component Value Date/Time   NA 132 (L) 07/27/2019 2129   K 3.5 07/27/2019 2129   CL 100 07/27/2019 2129   CO2 22 07/27/2019  2129   GLUCOSE 217 (H) 07/27/2019 2129   BUN 10 12/12/2021 0000   CREATININE 0.8 12/12/2021 0000   CREATININE 0.91 07/27/2019 2129   CALCIUM 8.8 (L) 07/27/2019 2129   PROT 7.6 07/27/2019 2129   ALBUMIN 4.2 07/27/2019 2129   AST 17 07/27/2019 2129   ALT 27 07/27/2019 2129   ALKPHOS 72 07/27/2019 2129   BILITOT 0.8 07/27/2019 2129   GFRNONAA >60 07/27/2019 2129   GFRAA >60 07/27/2019 2129     Diabetic Labs (most recent): Lab Results  Component Value Date   HGBA1C 6.3 (A) 02/04/2024   HGBA1C 6.6 (A) 10/02/2023   HGBA1C 5.6 06/02/2023   MICROALBUR 80mg /L 07/16/2022     Lipid Panel ( most recent) Lipid Panel     Component Value Date/Time   TRIG 443 (A) 12/12/2021 0000   LDLCALC 72 12/12/2021 0000      Lab Results  Component Value Date   TSH 2.85 12/12/2021           Assessment & Plan:   1) Type 2 diabetes mellitus with hyperglycemia, without long-term current use of insulin  (HCC)  He presents today, accompanied by his mother, with his logs showing inconsistent monitoring but mostly at target glycemic profile.  His POCT A1c today is 6.3%, improving from last visit of 6.6%.  He admits to drinking mostly Diet Mt Dew.  - PHILLP DOLORES has currently uncontrolled symptomatic type 2 DM since 48 years of age.   -Recent labs reviewed.  - I had a long discussion with him about the progressive nature of diabetes and the pathology behind its complications. -his diabetes is not currently complicated but he remains at a high risk for more acute and chronic complications which include CAD, CVA, CKD, retinopathy, and neuropathy. These are all discussed in detail with him.  The following Lifestyle Medicine recommendations according to American College of Lifestyle Medicine [facility]) were discussed and offered to patient and he agrees to start the journey:  A. Whole Foods, Plant-based plate comprising of fruits and vegetables, plant-based proteins, whole-grain carbohydrates was  discussed in detail with the patient.   A list for source of those nutrients were also provided to the patient.  Patient will use only water  or unsweetened tea for hydration. B.  The need to stay away from risky substances including alcohol, smoking; obtaining 7 to 9 hours of restorative sleep, at least 150 minutes of moderate intensity exercise weekly, the importance of healthy social connections,  and stress reduction techniques were discussed. C.  A full color page of  Calorie density of various food groups per pound showing examples of each food groups was provided to the patient.  - Nutritional counseling repeated/built upon at each appointment.  - The patient admits there is a room for improvement in their diet and drink choices. -  Suggestion is made for the patient to avoid simple carbohydrates from their diet including Cakes, Sweet Desserts / Pastries, Ice Cream, Soda (diet and regular), Sweet Tea, Candies, Chips, Cookies, Sweet Pastries, Store Bought Juices, Alcohol in Excess of 1-2 drinks a day, Artificial Sweeteners, Coffee Creamer, and Sugar-free Products. This will help patient to have stable blood glucose profile and potentially avoid unintended weight gain.   - I encouraged the patient to switch to unprocessed or minimally processed complex starch and increased protein intake (animal or plant source), fruits, and vegetables.   - Patient is advised to stick to a routine mealtimes to eat 3 meals a day and avoid unnecessary snacks (to snack only to correct hypoglycemia).  - I have approached him with the following individualized plan to manage his diabetes and patient agrees:   - he is advised to continue his Janumet  half tab of 50/1000 mg po twice daily, therapeutically suitable for patient.  There is a possibility he will be able to come off his medications altogether in the future, but will keep him on a bit longer to see if he can maintain on this low dose first.  He and his mother  are in agreement with this plan given the approaching holiday season.  -he is encouraged to continue monitoring glucose at least once daily 2-3 days per week, before breakfast and to call the clinic if he has readings less than 70 or above 200 for 3 tests in a row.  - Adjustment parameters are given to him for hypo and hyperglycemia in writing.  - Specific targets for  A1c; LDL, HDL, and Triglycerides were discussed with the patient.  2) Blood Pressure /Hypertension:  his blood pressure is controlled to target.   he is advised to continue his current medications as prescribed by PCP.  3) Lipids/Hyperlipidemia:    Review of his recent lipid panel from 12/12/21 showed controlled LDL at 72 and elevated triglycerides of 443 .  he is advised to continue Simvastatin  20 mg daily at bedtime.  Side effects and precautions discussed with him.   4)  Weight/Diet:  his Body mass index is 33.48 kg/m.  -  clearly complicating his diabetes care.   he is a candidate for weight loss. I discussed with him the fact that loss of 5 - 10% of his  current body weight will have the most impact on his diabetes management.  Exercise, and detailed carbohydrates information provided  -  detailed on discharge instructions.  5) Chronic Care/Health Maintenance: -he is on ACEI/ARB and Statin medications and is encouraged to initiate and continue to follow up with Ophthalmology, Dentist, Podiatrist at least yearly or according to recommendations, and advised to stay away from smoking. I have recommended yearly flu vaccine and pneumonia vaccine at least every 5 years; moderate intensity exercise for up to 150 minutes weekly; and sleep for at least 7 hours  a day.  - he is advised to maintain close follow up with Grooms, Charmaine, [place] for primary care needs, as well as his other providers for optimal and coordinated care.     I spent  27  minutes in the care of the patient today including review of labs from CMP, Lipids,  Thyroid  Function, Hematology (current and previous including abstractions from other facilities); face-to-face time discussing  his blood glucose readings/logs, discussing hypoglycemia and hyperglycemia episodes and symptoms, medications doses, his options of short and long term treatment based on the latest standards of care / guidelines;  discussion about incorporating lifestyle medicine;  and documenting the encounter. Risk reduction counseling performed per USPSTF guidelines to reduce obesity and cardiovascular risk factors.     Please refer to Patient Instructions for Blood Glucose Monitoring and Insulin /Medications Dosing Guide  in media tab for additional information. Please  also refer to  Patient Self Inventory in the Media  tab for reviewed elements of pertinent patient history.  Acheron K Bartnick participated in the discussions, expressed understanding, and voiced agreement with the above plans.  All questions were answered to his satisfaction. he is encouraged to contact clinic should he have any questions or concerns prior to his return visit.     Follow up plan: - Return in about 4 months (around 06/03/2024) for Diabetes F/U with A1c in office, No previsit labs, Bring meter and logs.   Benton Rio, [facility] [address] [place], [place] [address] Phone: [phone] Fax: [phone]  02/04/2024, 11:23 AM

## 2024-02-04 NOTE — Assessment & Plan Note (Signed)
 Stable. Continue with current management without changes. Discussed healthy diet and lifestyle.

## 2024-02-06 LAB — COMPREHENSIVE METABOLIC PANEL WITH GFR
ALT: 22 IU/L (ref 0–44)
AST: 19 IU/L (ref 0–40)
Albumin: 4.7 g/dL (ref 4.1–5.1)
Alkaline Phosphatase: 60 IU/L (ref 47–123)
BUN/Creatinine Ratio: 15 (ref 9–20)
BUN: 13 mg/dL (ref 6–24)
Bilirubin Total: 0.6 mg/dL (ref 0.0–1.2)
CO2: 23 mmol/L (ref 20–29)
Calcium: 9.7 mg/dL (ref 8.7–10.2)
Chloride: 100 mmol/L (ref 96–106)
Creatinine, Ser: 0.87 mg/dL (ref 0.76–1.27)
Globulin, Total: 2.7 g/dL (ref 1.5–4.5)
Glucose: 108 mg/dL — ABNORMAL HIGH (ref 70–99)
Potassium: 3.7 mmol/L (ref 3.5–5.2)
Sodium: 141 mmol/L (ref 134–144)
Total Protein: 7.4 g/dL (ref 6.0–8.5)
eGFR: 107 mL/min/1.73 (ref >59)

## 2024-02-06 LAB — CBC WITH DIFFERENTIAL/PLATELET
Basophils Absolute: 0.1 x10E3/uL (ref 0.0–0.2)
Basos: 1 %
EOS (ABSOLUTE): 0.4 x10E3/uL (ref 0.0–0.4)
Eos: 5 %
Hematocrit: 46.8 % (ref 37.5–51.0)
Hemoglobin: 15.7 g/dL (ref 13.0–17.7)
Immature Grans (Abs): 0 x10E3/uL (ref 0.0–0.1)
Immature Granulocytes: 0 %
Lymphocytes Absolute: 3.1 x10E3/uL (ref 0.7–3.1)
Lymphs: 39 %
MCH: 28.2 pg (ref 26.6–33.0)
MCHC: 33.5 g/dL (ref 31.5–35.7)
MCV: 84 fL (ref 79–97)
Monocytes Absolute: 0.5 x10E3/uL (ref 0.1–0.9)
Monocytes: 7 %
Neutrophils Absolute: 3.9 x10E3/uL (ref 1.4–7.0)
Neutrophils: 48 %
Platelets: 280 x10E3/uL (ref 150–450)
RBC: 5.57 x10E6/uL (ref 4.14–5.80)
RDW: 13 % (ref 11.6–15.4)
WBC: 8 x10E3/uL (ref 3.4–10.8)

## 2024-02-06 LAB — LIPID PANEL
Chol/HDL Ratio: 4.1 ratio (ref 0.0–5.0)
Cholesterol, Total: 152 mg/dL (ref 100–199)
HDL: 37 mg/dL — ABNORMAL LOW (ref >39)
LDL Chol Calc (NIH): 81 mg/dL (ref 0–99)
Triglycerides: 201 mg/dL — ABNORMAL HIGH (ref 0–149)
VLDL Cholesterol Cal: 34 mg/dL (ref 5–40)

## 2024-02-06 LAB — MICROALBUMIN / CREATININE URINE RATIO
Creatinine, Urine: 336.7 mg/dL
Microalb/Creat Ratio: 10 mg/g{creat} (ref 0–29)
Microalbumin, Urine: 33.3 ug/mL

## 2024-02-09 ENCOUNTER — Ambulatory Visit: Payer: Self-pay | Admitting: Physician Assistant

## 2024-02-11 DIAGNOSIS — Z1211 Encounter for screening for malignant neoplasm of colon: Secondary | ICD-10-CM | POA: Diagnosis not present

## 2024-02-19 LAB — COLOGUARD: COLOGUARD: NEGATIVE

## 2024-04-02 ENCOUNTER — Ambulatory Visit
Admission: EM | Admit: 2024-04-02 | Discharge: 2024-04-02 | Disposition: A | Discharge status: Other Acute Inpatient Hospital

## 2024-04-02 ENCOUNTER — Emergency Department (HOSPITAL_COMMUNITY)
Admission: EM | Admit: 2024-04-02 | Discharge: 2024-04-03 | Disposition: A | Discharge status: Home / Self Care | Attending: Emergency Medicine | Admitting: Emergency Medicine

## 2024-04-02 DIAGNOSIS — X58XXXA Exposure to other specified factors, initial encounter: Secondary | ICD-10-CM | POA: Insufficient documentation

## 2024-04-02 DIAGNOSIS — T423X1A Poisoning by barbiturates, accidental (unintentional), initial encounter: Secondary | ICD-10-CM | POA: Diagnosis not present

## 2024-04-02 DIAGNOSIS — F32A Depression, unspecified: Secondary | ICD-10-CM | POA: Diagnosis not present

## 2024-04-02 DIAGNOSIS — T50904A Poisoning by unspecified drugs, medicaments and biological substances, undetermined, initial encounter: Secondary | ICD-10-CM

## 2024-04-02 DIAGNOSIS — F4325 Adjustment disorder with mixed disturbance of emotions and conduct: Secondary | ICD-10-CM | POA: Diagnosis present

## 2024-04-02 DIAGNOSIS — F29 Unspecified psychosis not due to a substance or known physiological condition: Secondary | ICD-10-CM | POA: Insufficient documentation

## 2024-04-02 DIAGNOSIS — F988 Other specified behavioral and emotional disorders with onset usually occurring in childhood and adolescence: Secondary | ICD-10-CM | POA: Diagnosis present

## 2024-04-02 DIAGNOSIS — T50901A Poisoning by unspecified drugs, medicaments and biological substances, accidental (unintentional), initial encounter: Secondary | ICD-10-CM | POA: Diagnosis present

## 2024-04-02 LAB — URINALYSIS, ROUTINE W REFLEX MICROSCOPIC
Bilirubin Urine: NEGATIVE
Glucose, UA: NEGATIVE mg/dL
Hgb urine dipstick: NEGATIVE
Ketones, ur: NEGATIVE mg/dL
Leukocytes,Ua: NEGATIVE
Nitrite: NEGATIVE
Protein, ur: NEGATIVE mg/dL
Specific Gravity, Urine: 1.017 (ref 1.005–1.030)
pH: 6 (ref 5.0–8.0)

## 2024-04-02 LAB — CBC WITH DIFFERENTIAL/PLATELET
Abs Immature Granulocytes: 0.02 K/uL (ref 0.00–0.07)
Basophils Absolute: 0.1 K/uL (ref 0.0–0.1)
Basophils Relative: 1 %
Eosinophils Absolute: 0.4 K/uL (ref 0.0–0.5)
Eosinophils Relative: 4 %
HCT: 44.2 % (ref 39.0–52.0)
Hemoglobin: 15.3 g/dL (ref 13.0–17.0)
Immature Granulocytes: 0 %
Lymphocytes Relative: 33 %
Lymphs Abs: 2.6 K/uL (ref 0.7–4.0)
MCH: 28.5 pg (ref 26.0–34.0)
MCHC: 34.6 g/dL (ref 30.0–36.0)
MCV: 82.5 fL (ref 80.0–100.0)
Monocytes Absolute: 0.6 K/uL (ref 0.1–1.0)
Monocytes Relative: 7 %
Neutro Abs: 4.3 K/uL (ref 1.7–7.7)
Neutrophils Relative %: 55 %
Platelets: 360 K/uL (ref 150–400)
RBC: 5.36 MIL/uL (ref 4.22–5.81)
RDW: 13.3 % (ref 11.5–15.5)
WBC: 7.9 K/uL (ref 4.0–10.5)
nRBC: 0 % (ref 0.0–0.2)

## 2024-04-02 LAB — COMPREHENSIVE METABOLIC PANEL WITH GFR
ALT: 28 U/L (ref 0–44)
AST: 26 U/L (ref 15–41)
Albumin: 4.7 g/dL (ref 3.5–5.0)
Alkaline Phosphatase: 72 U/L (ref 38–126)
Anion gap: 16 — ABNORMAL HIGH (ref 5–15)
BUN: 10 mg/dL (ref 6–20)
CO2: 25 mmol/L (ref 22–32)
Calcium: 9.7 mg/dL (ref 8.9–10.3)
Chloride: 100 mmol/L (ref 98–111)
Creatinine, Ser: 0.84 mg/dL (ref 0.61–1.24)
GFR, Estimated: >60 mL/min
Glucose, Bld: 120 mg/dL — ABNORMAL HIGH (ref 70–99)
Potassium: 3.5 mmol/L (ref 3.5–5.1)
Sodium: 142 mmol/L (ref 135–145)
Total Bilirubin: 0.4 mg/dL (ref 0.0–1.2)
Total Protein: 7.5 g/dL (ref 6.5–8.1)

## 2024-04-02 LAB — URINE DRUG SCREEN
Amphetamines: NEGATIVE
Barbiturates: POSITIVE — AB
Benzodiazepines: NEGATIVE
Cocaine: NEGATIVE
Fentanyl: NEGATIVE
Methadone Scn, Ur: NEGATIVE
Opiates: NEGATIVE
Tetrahydrocannabinol: NEGATIVE

## 2024-04-02 LAB — ACETAMINOPHEN LEVEL: Acetaminophen (Tylenol), Serum: <10 ug/mL — ABNORMAL LOW (ref 10–30)

## 2024-04-02 LAB — ETHANOL: Alcohol, Ethyl (B): <15 mg/dL

## 2024-04-02 NOTE — Discharge Instructions (Addendum)
Patient transported to the emergency department via EMS. 

## 2024-04-02 NOTE — ED Notes (Signed)
 Poison Control is called at this time. They reccommended to continue to monitor pt until 0230 in the morning, obtain an EKG and tylenol  level.    Provider Zammit MD made aware

## 2024-04-02 NOTE — ED Notes (Signed)
 EMS at bedside. Report given. Pt family aware of pt transport to EMS as well.

## 2024-04-02 NOTE — ED Provider Notes (Signed)
 " [facility]    CSN: [id number] Arrival date & time: 04/02/24  1900      History   Chief Complaint No chief complaint on file.   HPI Legend Aaron Coffey is a 49 y.o. male.   The history is provided by a parent.   Patient brought in by his mother for an overdose of primidone .  Mother states patient took the medication between 6 and 7 this evening.  She states that he had an alcoholic beverage and had dinner around 4 PM prior to taking the medication.  Patient brought in by his mother, he is lethargic.  He is alert and oriented x 4.  Patient denies suicidal ideations, but cannot tell this provider why he took more than his usual dose of medication.  He denies chest pain, shortness of breath, or difficulty breathing.  Mother states she is unsure as to why he took the medication.  Per review of the chart, patient with history of suicidal behavior.  Per review of his medication list, he is prescribed primidone  50 mg, he is to take 100 mg twice daily.  Past Medical History:  Diagnosis Date   Dystonia    Essential hypertension    Tremor     Patient Active Problem List   Diagnosis Date Noted   Primary hypertension 02/04/2024   ADD (attention deficit disorder) without hyperactivity 05/30/2022   Diabetes mellitus (HCC) 05/30/2022   Hyperlipidemia 05/30/2022   Tremor 05/30/2022   Adjustment disorder with mixed disturbance of emotions and conduct     Past Surgical History:  Procedure Laterality Date   CHOLECYSTECTOMY     LAPAROSCOPIC APPENDECTOMY N/A 12/19/2017   Procedure: APPENDECTOMY LAPAROSCOPIC;  Surgeon: Mavis Anes, MD;  Location: [facility];  Service: General;  Laterality: N/A;       Home Medications    Prior to Admission medications  Medication Sig Start Date End Date Taking? Authorizing Provider  glucose blood (PRODIGY NO CODING BLOOD GLUC) test strip Use as instructed to monitor glucose once daily 02/04/24   Therisa Benton PARAS, NP  amLODipine  (NORVASC ) 5 MG  tablet Take 1 tablet (5 mg total) by mouth daily. 02/04/24   Grooms, Courtney, PA-C  brompheniramine-pseudoephedrine -DM 30-2-10 MG/5ML syrup Take 5 mLs by mouth 4 (four) times daily as needed. 07/16/23   Leath-Warren, Etta PARAS, NP  fluticasone  (FLONASE ) 50 MCG/ACT nasal spray Place 2 sprays into both nostrils daily. Patient taking differently: Place 2 sprays into both nostrils daily. As needed 07/16/23   Leath-Warren, Etta PARAS, NP  ipratropium (ATROVENT ) 0.03 % nasal spray Place 2 sprays into both nostrils 2 (two) times daily. 03/15/21   Christopher Savannah, PA-C  JANUMET  50-1000 MG tablet Take 0.5 tablets by mouth 2 (two) times daily with a meal. 02/04/24   Reardon, Benton PARAS, NP  lisinopril (ZESTRIL) 10 MG tablet Take 10 mg by mouth daily. 04/14/23   [provider]  Omega-3 Fatty Acids (FISH OIL PO) Take 1 capsule by mouth daily.    [provider]  primidone  (MYSOLINE ) 50 MG tablet Take 100 mg by mouth 2 (two) times daily. 01/10/14   [provider]  simvastatin  (ZOCOR ) 20 MG tablet Take 20 mg by mouth every evening. 01/10/14   [provider]  trihexyphenidyl  (ARTANE ) 2 MG tablet Take 1.5 tablets by mouth daily. 10/12/17   [provider]  trimethoprim -polymyxin b  (POLYTRIM ) ophthalmic solution Place 1 drop into both eyes 3 (three) times daily. For 7 days 06/11/22   Rodriguez-Southworth, Kyra, PA-C  Family History Family History  Problem Relation Age of Onset   Hypertension Mother     Social History Social History[1]   Allergies   Clindamycin/lincomycin   Review of Systems Review of Systems Per HPI  Physical Exam Triage Vital Signs ED Triage Vitals [04/02/24 1907]  Encounter Vitals Group     BP (!) 161/99     Girls Systolic BP Percentile      Girls Diastolic BP Percentile      Boys Systolic BP Percentile      Boys Diastolic BP Percentile      Pulse Rate (!) 103     Resp 14     Temp 98.6 F (37 C)     Temp Source Oral     SpO2 96  %     Weight      Height      Head Circumference      Peak Flow      Pain Score      Pain Loc      Pain Education      Exclude from Growth Chart    No data found.  Updated Vital Signs BP (!) 161/99 (BP Location: Right Arm)   Pulse (!) 103   Temp 98.6 F (37 C) (Oral)   Resp 14   SpO2 96%   Visual Acuity Right Eye Distance:   Left Eye Distance:   Bilateral Distance:    Right Eye Near:   Left Eye Near:    Bilateral Near:     Physical Exam Vitals and nursing note reviewed.  Constitutional:      Appearance: He is ill-appearing.  HENT:     Head: Normocephalic.  Eyes:     Extraocular Movements: Extraocular movements intact.     Pupils: Pupils are equal, round, and reactive to light.  Cardiovascular:     Rate and Rhythm: Tachycardia present.     Pulses: Normal pulses.     Heart sounds: Normal heart sounds.  Pulmonary:     Effort: Pulmonary effort is normal. No respiratory distress.     Breath sounds: Normal breath sounds. No stridor. No wheezing, rhonchi or rales.  Musculoskeletal:     Cervical back: Normal range of motion.  Skin:    General: Skin is warm and dry.  Neurological:     Mental Status: He is oriented to person, place, and time. He is lethargic.     Cranial Nerves: No facial asymmetry.     Motor: No seizure activity.      UC Treatments / Results  Labs (all labs ordered are listed, but only abnormal results are displayed) Labs Reviewed - No data to display  EKG   Radiology No results found.  Procedures Procedures (including critical care time)  Medications Ordered in UC Medications - No data to display  Initial Impression / Assessment and Plan / UC Course  I have reviewed the triage vital signs and the nursing notes.  Pertinent labs & imaging results that were available during my care of the patient were reviewed by me and considered in my medical decision making (see chart for details).  Patient brought in by his mother, lethargic,  but alert and oriented.  Mother reports that patient took 4 primidone , his usual dose is 2 tablets twice daily.  Mother also reports patient had an alcoholic beverage around 4 PM, states he took the medication between 6 and 7 PM.  Patient is speaking in complete sentences, denies chest pain, shortness of breath,  or difficulty breathing.  Patient denies suicidal ideations, but cannot tell this provider why he took the incorrect dose of his medication.  EMS was notified for transport to the emergency department.  Number 20-gauge PIV established in the right AC.  Patient transported to the emergency department via EMS.  Patient's family at the bedside.  Final Clinical Impressions(s) / UC Diagnoses   Final diagnoses:  None   Discharge Instructions   None    ED Prescriptions   None    PDMP not reviewed this encounter.    [1]  Social History Tobacco Use   Smoking status: Never   Smokeless tobacco: Never  Vaping Use   Vaping status: Never Used  Substance Use Topics   Alcohol use: Yes    Alcohol/week: 1.0 standard drink of alcohol    Types: 1 Cans of beer per week    Comment: Occasional   Drug use: No     Gilmer Etta PARAS, NP 04/02/24 1922  "

## 2024-04-02 NOTE — ED Triage Notes (Signed)
 Pt comes by EMS for accidental overdose on seizure medication, primidone . Pt was supposed to take 1 but took 4 pills total. Etoh was onboard.

## 2024-04-02 NOTE — ED Notes (Signed)
 Patient is being discharged from the Urgent Care and sent to the Emergency Department via EMS . Per NP, patient is in need of higher level of care due to injestion of too much medication. Patient is aware and verbalizes understanding of plan of care.  Vitals:   04/02/24 1907 04/02/24 1916  BP: (!) 161/99   Pulse: (!) 103 (!) 104  Resp: 14 16  Temp: 98.6 F (37 C)   SpO2: 96% 97%

## 2024-04-02 NOTE — ED Notes (Addendum)
 When the mother was out of the room. This nurse asked the pt if taking too much meds was accidentally or on purpose. The pt replied it was intential and he has negative thoughts. Pt would not explain any further what those negative thoughts were.    By reviewing the pt's hx he does have a hx of S/I

## 2024-04-02 NOTE — ED Notes (Signed)
 Report called to Ginger,RN at [facility] ED.

## 2024-04-02 NOTE — ED Notes (Addendum)
 CMA notified RN of pt presentation and inability to recall events related to medication this pm. triage unable to be completed prior to EMS arrival. NP at bedside assessing pt and same time RN notified.  Pt alert to speech. Denies pain or difficulty breathing. Airway patent.  EMS called.20g IV RAC placed. Pt tolerated well.

## 2024-04-02 NOTE — ED Triage Notes (Signed)
 Per mom pt has taken too many of his primidone  medication. Was supposed to take 1 but has taken 4 pills. Has also had alcoholic beverage.

## 2024-04-02 NOTE — BH Assessment (Signed)
 Patient was deferred to [facility] for a telepsych assessment. The assigned care coordinator will provide updates regarding the scheduling of the assessment. [facility] coordinator can be reached at [phone] for further information on the timing of the telepsych evaluation.

## 2024-04-02 NOTE — ED Provider Notes (Signed)
 " [name] Provider Note   CSN: [id number] Arrival date & time: 04/02/24  8070     Patient presents with: Drug Overdose (Seizure medication)   Aaron Coffey is a 49 y.o. male.   Patient took 4 primidone  tablets which were 50 mg at around 630 today.  He is supposed to only take 1 tablet.  Patient told the nurse that he was trying to hurt himself.  The history is provided by the patient and medical records. No language interpreter was used.  Drug Overdose This is a new problem. The current episode started 3 to 5 hours ago. The problem occurs rarely. The problem has not changed since onset.Pertinent negatives include no chest pain, no abdominal pain and no headaches. Nothing aggravates the symptoms. Nothing relieves the symptoms. He has tried nothing for the symptoms.       Prior to Admission medications  Medication Sig Start Date End Date Taking? Authorizing Provider  glucose blood (PRODIGY NO CODING BLOOD GLUC) test strip Use as instructed to monitor glucose once daily 02/04/24   Therisa Benton PARAS, NP  amLODipine  (NORVASC ) 5 MG tablet Take 1 tablet (5 mg total) by mouth daily. 02/04/24   Grooms, Courtney, PA-C  brompheniramine-pseudoephedrine -DM 30-2-10 MG/5ML syrup Take 5 mLs by mouth 4 (four) times daily as needed. 07/16/23   Leath-Warren, Etta PARAS, NP  fluticasone  (FLONASE ) 50 MCG/ACT nasal spray Place 2 sprays into both nostrils daily. Patient taking differently: Place 2 sprays into both nostrils daily. As needed 07/16/23   Leath-Warren, Etta PARAS, NP  ipratropium (ATROVENT ) 0.03 % nasal spray Place 2 sprays into both nostrils 2 (two) times daily. 03/15/21   Christopher Savannah, PA-C  JANUMET  50-1000 MG tablet Take 0.5 tablets by mouth 2 (two) times daily with a meal. 02/04/24   Reardon, Benton PARAS, NP  lisinopril (ZESTRIL) 10 MG tablet Take 10 mg by mouth daily. 04/14/23   [provider]  Omega-3 Fatty Acids (FISH OIL PO) Take 1  capsule by mouth daily.    [provider]  primidone  (MYSOLINE ) 50 MG tablet Take 100 mg by mouth 2 (two) times daily. 01/10/14   [provider]  simvastatin  (ZOCOR ) 20 MG tablet Take 20 mg by mouth every evening. 01/10/14   [provider]  trihexyphenidyl  (ARTANE ) 2 MG tablet Take 1.5 tablets by mouth daily. 10/12/17   [provider]  trimethoprim -polymyxin b  (POLYTRIM ) ophthalmic solution Place 1 drop into both eyes 3 (three) times daily. For 7 days 06/11/22   Rodriguez-Southworth, Kyra, PA-C    Allergies: Clindamycin/lincomycin    Review of Systems  Constitutional:  Negative for appetite change and fatigue.  HENT:  Negative for congestion, ear discharge and sinus pressure.   Eyes:  Negative for discharge.  Respiratory:  Negative for cough.   Cardiovascular:  Negative for chest pain.  Gastrointestinal:  Negative for abdominal pain and diarrhea.  Genitourinary:  Negative for frequency and hematuria.  Musculoskeletal:  Negative for back pain.  Skin:  Negative for rash.  Neurological:  Negative for seizures and headaches.  Psychiatric/Behavioral:  Positive for dysphoric mood. Negative for hallucinations.     Updated Vital Signs BP (!) 158/89   Pulse (!) 106   Temp 98.3 F (36.8 C) (Oral)   Resp 20   SpO2 94%   Physical Exam Vitals and nursing note reviewed.  Constitutional:      Appearance: He is well-developed.  HENT:     Head: Normocephalic.  Nose: Nose normal.  Eyes:     General: No scleral icterus.    Conjunctiva/sclera: Conjunctivae normal.  Neck:     Thyroid : No thyromegaly.  Cardiovascular:     Rate and Rhythm: Normal rate and regular rhythm.     Heart sounds: No murmur heard.    No friction rub. No gallop.  Pulmonary:     Breath sounds: No stridor. No wheezing or rales.  Chest:     Chest wall: No tenderness.  Abdominal:     General: There is no distension.     Tenderness: There is no abdominal tenderness. There is  no rebound.  Musculoskeletal:        General: Normal range of motion.     Cervical back: Neck supple.  Lymphadenopathy:     Cervical: No cervical adenopathy.  Skin:    Findings: No erythema or rash.  Neurological:     Mental Status: He is alert and oriented to person, place, and time.     Motor: No abnormal muscle tone.     Coordination: Coordination normal.  Psychiatric:     Comments: Depressed and suicidal     (all labs ordered are listed, but only abnormal results are displayed) Labs Reviewed  COMPREHENSIVE METABOLIC PANEL WITH GFR - Abnormal; Notable for the following components:      Result Value   Glucose, Bld 120 (*)    Anion gap 16 (*)    All other components within normal limits  ACETAMINOPHEN  LEVEL - Abnormal; Notable for the following components:   Acetaminophen  (Tylenol ), Serum <10 (*)    All other components within normal limits  URINE DRUG SCREEN - Abnormal; Notable for the following components:   Barbiturates POSITIVE (*)    All other components within normal limits  CBC WITH DIFFERENTIAL/PLATELET  URINALYSIS, ROUTINE W REFLEX MICROSCOPIC  ETHANOL    EKG: None  Radiology: No results found.   Procedures   Medications Ordered in the ED - No data to display Patient is medically cleared                                 Medical Decision Making Amount and/or Complexity of Data Reviewed Labs: ordered.   Patient with depression and ingestion of 4 primidone  tablets.  Patient has been medically cleared and will be evaluated by behavioral health.   Dispostion will be done by Dr. Geroldine     Final diagnoses:  None    ED Discharge Orders     None          [name], MD 04/04/24 1647  "

## 2024-04-03 DIAGNOSIS — T423X1A Poisoning by barbiturates, accidental (unintentional), initial encounter: Secondary | ICD-10-CM | POA: Diagnosis not present

## 2024-04-03 LAB — ACETAMINOPHEN LEVEL: Acetaminophen (Tylenol), Serum: <10 ug/mL — ABNORMAL LOW (ref 10–30)

## 2024-04-03 NOTE — Progress Notes (Signed)
 [facility] Consult Note  Patient Name: Aaron Coffey MRN: [id number] DOB: [birth year] DATE OF Consult: 04/03/2024 Consult Order details:  Orders (From admission, onward)     Start     Ordered   04/02/24 2039  CONSULT TO CALL ACT TEAM       Ordering Provider: [name], MD  Provider:  (Not yet assigned)  Question:  Reason for Consult?  Answer:  suicidal   04/02/24 2039            PRIMARY PSYCHIATRIC DIAGNOSES  1.    Adjustment disorder with mixed disturbance of emotion and conduct. 2.    Rule out acute stress reaction. 3.    ADHD inattentive type, by history.   PSYCHIATRIC FORMULATION Based on my current evaluation and assessment of this patient, who presents with complaints of intentional overdose on primidone  per the nurse due to negative thoughts.  The patient presentation is consistent with adjustment disorder, rule out acute stress reaction and ADHD inattentive type by history  The patient currently denies SI or HI.  The patient is a high risk for suicide due to  intentional overdose on primidone  due to negative thoughts.  Therefore inpatient psych admission is recommended.  The patient would benefit from inpatient psychiatric stabilization and treatment after he is medically cleared or stable.   RECOMMENDATIONS   Medication recommendations:  None at this time.  Nonmedication/therapeutic recommendations:  Supportive care, inpatient psych admission,   Is inpatient psychiatric hospitalization recommended for this patient?   Yes.   Communications: Treatment team members, and family members if applicable, with whom risk formulation and management, and other related findings, were reviewed include the following:  Secure chat to the treatment team.  I personally spent a total of 60 minutes in the care of the patient today including preparing to see the patient, getting/reviewing separately obtained history, performing a medically appropriate exam/evaluation,  counseling and educating, placing orders, documenting clinical information in the EHR, and coordinating care.  Thank you for involving us  in the care of this patient. If you have any additional questions or concerns, please call [phone] and ask for me or the provider on-call.  TELEPSYCHIATRY ATTESTATION & CONSENT  As the provider for this telehealth consult, I attest that I verified the patients identity using two separate identifiers, introduced myself to the patient, provided my credentials, disclosed my location, and performed this encounter via a HIPAA-compliant, real-time, face-to-face, two-way, interactive audio and video platform and with the full consent and agreement of the patient (or guardian as applicable.)  Patient physical location:   [facility] ED unit in [place] [facility] provider physical location: home office in state of  [place] .  Video start time: 2300 (Central Time) Video end time: 0005 (Central Time)   IDENTIFYING DATA  Aaron Coffey is a 49 y.o. year-old male for whom a psychiatric consultation has been ordered by the primary provider. The patient was identified using two separate identifiers.   CHIEF COMPLAINT/REASON FOR CONSULT    Intentional overdose on primidone  .  HISTORY OF PRESENT ILLNESS (HPI)   Per ED provider, Aaron Coffey is a 49 y.o. male. The history is provided by a parent.  Patient brought in by his mother for an overdose of primidone .  Mother states patient took the medication between 6 and 7 this evening.  She states that he had an alcoholic beverage and had dinner around 4 PM prior to taking the medication.  Patient brought in by his mother, he is lethargic.  He is alert and oriented x 4.  Patient denies suicidal ideations, but cannot tell this provider why he took more than his usual dose of medication.  He denies chest pain, shortness of breath, or difficulty breathing.  Mother states she is unsure as to why he took the medication.  Per  review of the chart, patient with history of suicidal behavior.  Per review of his medication list, he is prescribed primidone  50 mg, he is to take 100 mg twice daily.  I evaluated the patient today face-to-face via secure, HIPAA-compliant telepsychiatric connection, and at the request of the primary treatment team. The reason for the telepsychiatric consultation is that the patient is a 49 y.o. male who  Present to the ED due to intentional overdose on primidone . Currently during this psychiatric interview with the patient's mother in the room, the patient denies feeling depressed and denies thoughts of suicide or homicide.  Currently he states that he does not know the reason why he overdosed on 4 tablets of primidone  but the patient's nurse reported he was having negative thoughts. The patient denies any current stressors or triggers to overdose on primidone  today.   He denies mood swings or symptoms of mania.  He denies any auditory or visual hallucinations or paranoia. The patient's mother reported that the patient has had similar overdose on medications years ago.  The mother states that the patient did not tell him why he overdosed on this medication.  Currently the patient appears withdrawn and guarded.  He denies anxiety or panic attacks.  The patient mother reports that patient has trouble sleeping at night due to sleep apnea and that he does not want to use the CPAP machine.  He denies symptoms of hallucinations or paranoia.  He states that he drinks alcohol occasionally with last drink today and he took a glass of liquor.  He denies any withdrawals.He denies any other substance abuse.   PAST PSYCHIATRIC HISTORY  Adjustment disorder. ADHD.  Otherwise as per HPI above.  PAST MEDICAL HISTORY  Past Medical History:  Diagnosis Date   Dystonia    Essential hypertension    Tremor      HOME MEDICATIONS  PTA Medications  Medication Sig   simvastatin  (ZOCOR ) 20 MG tablet Take 20 mg by mouth  every evening.   primidone  (MYSOLINE ) 50 MG tablet Take 100 mg by mouth 2 (two) times daily.   Omega-3 Fatty Acids (FISH OIL PO) Take 1 capsule by mouth daily.   trihexyphenidyl  (ARTANE ) 2 MG tablet Take 1.5 tablets by mouth daily.   ipratropium (ATROVENT ) 0.03 % nasal spray Place 2 sprays into both nostrils 2 (two) times daily.   trimethoprim -polymyxin b  (POLYTRIM ) ophthalmic solution Place 1 drop into both eyes 3 (three) times daily. For 7 days   lisinopril (ZESTRIL) 10 MG tablet Take 10 mg by mouth daily.   fluticasone  (FLONASE ) 50 MCG/ACT nasal spray Place 2 sprays into both nostrils daily. (Patient taking differently: Place 2 sprays into both nostrils daily. As needed)   brompheniramine-pseudoephedrine -DM 30-2-10 MG/5ML syrup Take 5 mLs by mouth 4 (four) times daily as needed.   amLODipine  (NORVASC ) 5 MG tablet Take 1 tablet (5 mg total) by mouth daily.   JANUMET  50-1000 MG tablet Take 0.5 tablets by mouth 2 (two) times daily with a meal.   glucose blood (PRODIGY NO CODING BLOOD GLUC) test strip Use as instructed to monitor glucose once daily     ALLERGIES  Allergies[1]  SOCIAL & SUBSTANCE USE HISTORY  Social History   Socioeconomic History   Marital status: Single    Spouse name: Not on file   Number of children: Not on file   Years of education: Not on file   Highest education level: Not on file  Occupational History   Not on file  Tobacco Use   Smoking status: Never   Smokeless tobacco: Never  Vaping Use   Vaping status: Never Used  Substance and Sexual Activity   Alcohol use: Yes    Alcohol/week: 1.0 standard drink of alcohol    Types: 1 Cans of beer per week    Comment: Occasional   Drug use: No   Sexual activity: Never  Other Topics Concern   Not on file  Social History Narrative   Not on file   Social Drivers of Health   Tobacco Use: Low Risk (02/04/2024)   Patient History    Smoking Tobacco Use: Never    Smokeless Tobacco Use: Never    Passive Exposure:  Not on file  Financial Resource Strain: Low Risk (05/30/2022)   Overall Financial Resource Strain (CARDIA)    Difficulty of Paying Living Expenses: Not very hard  Food Insecurity: No Food Insecurity (05/30/2022)   Hunger Vital Sign    Worried About Running Out of Food in the Last Year: Never true    Ran Out of Food in the Last Year: Never true  Transportation Needs: Not on file  Physical Activity: Inactive (05/30/2022)   Exercise Vital Sign    Days of Exercise per Week: 0 days    Minutes of Exercise per Session: 0 min  Stress: No Stress Concern Present (05/30/2022)   [employer] of Occupational Health - Occupational Stress Questionnaire    Feeling of Stress : Only a little  Social Connections: Socially Isolated (05/30/2022)   Social Connection and Isolation Panel    Frequency of Communication with Friends and Family: More than three times a week    Frequency of Social Gatherings with Friends and Family: Twice a week    Attends Religious Services: Never    Database Administrator or Organizations: No    Attends Banker Meetings: Never    Marital Status: Never married  Depression (PHQ2-9): Low Risk (02/04/2024)   Depression (PHQ2-9)    PHQ-2 Score: 2  Alcohol Screen: Low Risk (05/30/2022)   Alcohol Screen    Last Alcohol Screening Score (AUDIT): 2  Housing: Low Risk (05/30/2022)   Housing    Last Housing Risk Score: 0  Utilities: Not on file  Health Literacy: Not on file   Tobacco Use History[2] Social History   Substance and Sexual Activity  Alcohol Use Yes   Alcohol/week: 1.0 standard drink of alcohol   Types: 1 Cans of beer per week   Comment: Occasional   Social History   Substance and Sexual Activity  Drug Use No    Additional pertinent information See HPI as above.SABRA  FAMILY HISTORY  Family History  Problem Relation Age of Onset   Hypertension Mother    Family Psychiatric History (if known):    MENTAL STATUS EXAM (MSE)  Mental Status  Exam: General Appearance: Guarded and Well Groomed  Orientation:  Full (Time, Place, and Person)  Memory:  Immediate;   Good  Concentration:  Concentration: Fair  Recall:  Fair  Attention  Fair  Eye Contact:  Fair  Speech:  Slow  Language:  Good  Volume:  Decreased  Mood: Fine  Affect:  Blunt  Thought  Process:  Linear  Thought Content:  WDL  Suicidal Thoughts:  No  Homicidal Thoughts:  No  Judgement:  Fair  Insight:  Fair  Psychomotor Activity:  Decreased  Akathisia:  No  Fund of Knowledge:  Fair    Assets:  Engineer, Maintenance Social Support  Cognition:  WNL  ADL's:  Intact  AIMS (if indicated):       VITALS  Blood pressure (!) 158/89, pulse (!) 106, temperature 98.3 F (36.8 C), temperature source Oral, resp. rate 20, SpO2 94%.  LABS  Admission on 04/02/2024  Component Date Value Ref Range Status   WBC 04/02/2024 7.9  4.0 - 10.5 K/uL Final   RBC 04/02/2024 5.36  4.22 - 5.81 MIL/uL Final   Hemoglobin 04/02/2024 15.3  13.0 - 17.0 g/dL Final   HCT 98/97/7973 44.2  39.0 - 52.0 % Final   MCV 04/02/2024 82.5  80.0 - 100.0 fL Final   MCH 04/02/2024 28.5  26.0 - 34.0 pg Final   MCHC 04/02/2024 34.6  30.0 - 36.0 g/dL Final   RDW 98/97/7973 13.3  11.5 - 15.5 % Final   Platelets 04/02/2024 360  150 - 400 K/uL Final   nRBC 04/02/2024 0.0  0.0 - 0.2 % Final   Neutrophils Relative % 04/02/2024 55  % Final   Neutro Abs 04/02/2024 4.3  1.7 - 7.7 K/uL Final   Lymphocytes Relative 04/02/2024 33  % Final   Lymphs Abs 04/02/2024 2.6  0.7 - 4.0 K/uL Final   Monocytes Relative 04/02/2024 7  % Final   Monocytes Absolute 04/02/2024 0.6  0.1 - 1.0 K/uL Final   Eosinophils Relative 04/02/2024 4  % Final   Eosinophils Absolute 04/02/2024 0.4  0.0 - 0.5 K/uL Final   Basophils Relative 04/02/2024 1  % Final   Basophils Absolute 04/02/2024 0.1  0.0 - 0.1 K/uL Final   Immature Granulocytes 04/02/2024 0  % Final   Abs Immature Granulocytes 04/02/2024 0.02  0.00 - 0.07 K/uL Final    Performed at [facility], [address] [place], [place] [address]   Sodium 04/02/2024 142  135 - 145 mmol/L Final   Potassium 04/02/2024 3.5  3.5 - 5.1 mmol/L Final   Chloride 04/02/2024 100  98 - 111 mmol/L Final   CO2 04/02/2024 25  22 - 32 mmol/L Final   Glucose, Bld 04/02/2024 120 (H)  70 - 99 mg/dL Final   Glucose reference range applies only to samples taken after fasting for at least 8 hours.   BUN 04/02/2024 10  6 - 20 mg/dL Final   Creatinine, Ser 04/02/2024 0.84  0.61 - 1.24 mg/dL Final   Calcium 98/97/7973 9.7  8.9 - 10.3 mg/dL Final   Total Protein 98/97/7973 7.5  6.5 - 8.1 g/dL Final   Albumin 98/97/7973 4.7  3.5 - 5.0 g/dL Final   AST 98/97/7973 26  15 - 41 U/L Final   ALT 04/02/2024 28  0 - 44 U/L Final   Alkaline Phosphatase 04/02/2024 72  38 - 126 U/L Final   Total Bilirubin 04/02/2024 0.4  0.0 - 1.2 mg/dL Final   GFR, Estimated 04/02/2024 >60  >60 mL/min Final   Comment: (NOTE) Calculated using the CKD-EPI Creatinine Equation (2021)    Anion gap 04/02/2024 16 (H)  5 - 15 Final   Performed at [facility], [address], [place], [place] [address]   Acetaminophen  (Tylenol ), Serum 04/02/2024 <10 (L)  10 - 30 ug/mL Final   Comment: (NOTE) Toxic concentrations  can be more effectively related to post dose interval; >200, >100, and >50 ug/mL serum concentrations correspond to toxic concentrations at 4, 8, and 12 hours post dose, respectively.  Performed at [facility], [address], [place], [place] [address]    Color, Urine 04/02/2024 YELLOW  YELLOW Final   APPearance 04/02/2024 CLEAR  CLEAR Final   Specific Gravity, Urine 04/02/2024 1.017  1.005 - 1.030 Final   pH 04/02/2024 6.0  5.0 - 8.0 Final   Glucose, UA 04/02/2024 NEGATIVE  NEGATIVE mg/dL Final   Hgb urine dipstick 04/02/2024 NEGATIVE  NEGATIVE Final   Bilirubin Urine 04/02/2024 NEGATIVE  NEGATIVE Final   Ketones, ur 04/02/2024 NEGATIVE  NEGATIVE mg/dL Final   Protein, ur 98/97/7973 NEGATIVE   NEGATIVE mg/dL Final   Nitrite 98/97/7973 NEGATIVE  NEGATIVE Final   Leukocytes,Ua 04/02/2024 NEGATIVE  NEGATIVE Final   Performed at [facility], [address], [place], [place] [address]   Opiates 04/02/2024 NEGATIVE  NEGATIVE Final   Cocaine 04/02/2024 NEGATIVE  NEGATIVE Final   Benzodiazepines 04/02/2024 NEGATIVE  NEGATIVE Final   Amphetamines 04/02/2024 NEGATIVE  NEGATIVE Final   Tetrahydrocannabinol 04/02/2024 NEGATIVE  NEGATIVE Final   Barbiturates 04/02/2024 POSITIVE (A)  NEGATIVE Final   Methadone Scn, Ur 04/02/2024 NEGATIVE  NEGATIVE Final   Fentanyl  04/02/2024 NEGATIVE  NEGATIVE Final   Comment: (NOTE) Drug screen is for Medical Purposes only. Positive results are preliminary only. If confirmation is needed, notify lab within 5 days.  Drug Class                 Cutoff (ng/mL) Amphetamine and metabolites 1000 Barbiturate and metabolites 200 Benzodiazepine              200 Opiates and metabolites     300 Cocaine and metabolites     300 THC                         50 Fentanyl                     5 Methadone                   300  Trazodone is metabolized in vivo to several metabolites,  including pharmacologically active m-CPP, which is excreted in the  urine.  Immunoassay screens for amphetamines and MDMA have potential  cross-reactivity with these compounds and may provide false positive  result.  Performed at [facility], [address], [place], [place] [address]    Alcohol, Ethyl (B) 04/02/2024 <15  <15 mg/dL Final   Comment: (NOTE) For medical purposes only. Performed at [facility], [address], [place], [place] [address]     PSYCHIATRIC REVIEW OF SYSTEMS (ROS)  ROS: Notable for the following relevant positive findings: ROS  Additional findings:      Musculoskeletal: No abnormal movements observed      Gait & Station: Laying/Sitting      Pain Screening: Denies      Nutrition & Dental Concerns: Decrease in food intake and/or loss of appetite  RISK  FORMULATION/ASSESSMENT  Is the patient experiencing any suicidal or homicidal ideations: No       Explain if yes:  Protective factors considered for safety management:    inpatient psych admission.  Risk factors/concerns considered for safety management:  Substance abuse/dependence Physical illness/chronic pain Impulsivity Male gender Unmarried  Is there a safety management plan with the patient and treatment team to minimize risk  factors and promote protective factors: Yes           Explain:   Medical and supportive care. Is crisis care placement or psychiatric hospitalization recommended: Yes     Based on my current evaluation and risk assessment, patient is determined at this time to be at:    High risk  *RISK ASSESSMENT Risk assessment is a dynamic process; it is possible that this patient's condition, and risk level, may change. This should be re-evaluated and managed over time as appropriate. Please re-consult psychiatric consult services if additional assistance is needed in terms of risk assessment and management. If your team decides to discharge this patient, please advise the patient how to best access emergency psychiatric services, or to call 911, if their condition worsens or they feel unsafe in any way.   Bowyn Mercier, NP Telepsychiatry Consult Services    [1]  Allergies Allergen Reactions   Clindamycin/Lincomycin Rash  [2]  Social History Tobacco Use  Smoking Status Never  Smokeless Tobacco Never

## 2024-04-03 NOTE — ED Provider Notes (Signed)
" °  Physical Exam  BP (!) 158/89   Pulse (!) 106   Temp 98.3 F (36.8 C) (Oral)   Resp 20   SpO2 94%   Physical Exam Vitals and nursing note reviewed.  Constitutional:      Appearance: Normal appearance.  HENT:     Head: Normocephalic.  Pulmonary:     Effort: Pulmonary effort is normal.  Skin:    General: Skin is warm and dry.  Neurological:     Mental Status: He is alert and oriented to person, place, and time.  Psychiatric:        Attention and Perception: Attention normal.        Mood and Affect: Mood normal.        Speech: Speech is delayed.        Behavior: Behavior is withdrawn.        Thought Content: Thought content does not include homicidal or suicidal ideation. Thought content does not include homicidal or suicidal plan.     Procedures  Procedures  ED Course / MDM    Medical Decision Making Amount and/or Complexity of Data Reviewed Labs: ordered.   I have assumed care of this patient from Dr. Zammit.  CTS consultation has been completed.  He was initially cleared for discharge, however there were concerns that he was not being forthright in the presence of his mother.  Recommendations then changed to inpatient.  I have seen and evaluated the patient and spoken with the patient and the patient's mother.  They are both adamant that he not be admitted and that if he were admitted would not comply with any treatment offered to him.  He tells me that he is not suicidal and not homicidal and has assured me that he is safe to go home.  I will discharge him at his and his mother's request.       Geroldine Berg, MD 04/03/24 [phone]  "

## 2024-04-03 NOTE — Discharge Instructions (Signed)
 Return to the emergency department if you develop any new and/or concerning issues.

## 2024-05-05 ENCOUNTER — Other Ambulatory Visit: Payer: Self-pay | Admitting: Physician Assistant

## 2024-05-05 ENCOUNTER — Telehealth: Payer: Self-pay | Admitting: *Deleted

## 2024-05-05 MED ORDER — SITAGLIPTIN PHOSPHATE 25 MG PO TABS: 25.0000 mg | ORAL_TABLET | Freq: Every day | ORAL | 1 refills | Status: AC

## 2024-05-05 MED ORDER — METFORMIN HCL ER 500 MG PO TB24: 500.0000 mg | ORAL_TABLET | Freq: Every day | ORAL | 1 refills | Status: AC

## 2024-05-05 NOTE — Telephone Encounter (Signed)
 Patient's mother, Arland was called and made aware.

## 2024-05-05 NOTE — Telephone Encounter (Signed)
 Ok, we can just separate them out and do lower doses with it as well since we are ultimately trying to get him off of this.  Will start Metformin  500 mg ER once daily and Januvia  25 mg once daily (recommend taking both after breakfast).  I have sent these both to the pharmacy.

## 2024-05-05 NOTE — Telephone Encounter (Signed)
 Arland , patients mother left a message that insurance will no longer be paying for his Janumet  50/1000mg . Patient has only 2 left.  I called [facility] and talked with Krystal, he shares that they only get this in a bottle of 60 and they do not break the bottle for 30.  Other alternatives per him are Metformin  1000 mg , Januvia  , and Tradjenta .  Please send a new RX to [facility].

## 2024-06-03 ENCOUNTER — Ambulatory Visit: Admitting: Nurse Practitioner

## 2024-08-02 ENCOUNTER — Ambulatory Visit: Admitting: Physician Assistant
